# Patient Record
Sex: Male | Born: 1975 | Race: White | Hispanic: No | Marital: Married | Smoking: Former smoker
Health system: Southern US, Community
[De-identification: ages and names within clinical notes are randomized; demographics above are authoritative.]

## PROBLEM LIST (undated history)

## (undated) VITALS — BP 112/78 | HR 93 | Temp 97.5°F | Resp 16

## (undated) DIAGNOSIS — R748 Abnormal levels of other serum enzymes: Secondary | ICD-10-CM

## (undated) DIAGNOSIS — D649 Anemia, unspecified: Secondary | ICD-10-CM

## (undated) DIAGNOSIS — K219 Gastro-esophageal reflux disease without esophagitis: Secondary | ICD-10-CM

## (undated) DIAGNOSIS — K760 Fatty (change of) liver, not elsewhere classified: Secondary | ICD-10-CM

## (undated) DIAGNOSIS — F1010 Alcohol abuse, uncomplicated: Secondary | ICD-10-CM

## (undated) HISTORY — DX: Fatty (change of) liver, not elsewhere classified: K76.0

## (undated) HISTORY — DX: Anemia, unspecified: D64.9

## (undated) HISTORY — DX: Abnormal levels of other serum enzymes: R74.8

## (undated) HISTORY — DX: Gastro-esophageal reflux disease without esophagitis: K21.9

---

## 2006-11-16 ENCOUNTER — Emergency Department: Payer: Self-pay | Admitting: Emergency Medicine

## 2011-09-14 ENCOUNTER — Emergency Department (HOSPITAL_COMMUNITY): Payer: Self-pay

## 2011-09-14 ENCOUNTER — Emergency Department (HOSPITAL_COMMUNITY)
Admission: EM | Admit: 2011-09-14 | Discharge: 2011-09-14 | Disposition: A | Discharge status: Home / Self Care | Payer: Self-pay | Attending: Emergency Medicine | Admitting: Emergency Medicine

## 2011-09-14 ENCOUNTER — Encounter (HOSPITAL_COMMUNITY): Payer: Self-pay | Admitting: *Deleted

## 2011-09-14 DIAGNOSIS — R066 Hiccough: Secondary | ICD-10-CM | POA: Insufficient documentation

## 2011-09-14 DIAGNOSIS — R748 Abnormal levels of other serum enzymes: Secondary | ICD-10-CM

## 2011-09-14 DIAGNOSIS — R1110 Vomiting, unspecified: Secondary | ICD-10-CM | POA: Insufficient documentation

## 2011-09-14 DIAGNOSIS — R634 Abnormal weight loss: Secondary | ICD-10-CM | POA: Insufficient documentation

## 2011-09-14 DIAGNOSIS — R109 Unspecified abdominal pain: Secondary | ICD-10-CM | POA: Insufficient documentation

## 2011-09-14 DIAGNOSIS — R7402 Elevation of levels of lactic acid dehydrogenase (LDH): Secondary | ICD-10-CM | POA: Insufficient documentation

## 2011-09-14 DIAGNOSIS — R112 Nausea with vomiting, unspecified: Secondary | ICD-10-CM | POA: Insufficient documentation

## 2011-09-14 DIAGNOSIS — R7401 Elevation of levels of liver transaminase levels: Secondary | ICD-10-CM | POA: Insufficient documentation

## 2011-09-14 DIAGNOSIS — R51 Headache: Secondary | ICD-10-CM | POA: Insufficient documentation

## 2011-09-14 DIAGNOSIS — R160 Hepatomegaly, not elsewhere classified: Secondary | ICD-10-CM | POA: Insufficient documentation

## 2011-09-14 DIAGNOSIS — R630 Anorexia: Secondary | ICD-10-CM | POA: Insufficient documentation

## 2011-09-14 LAB — DIFFERENTIAL
Lymphocytes Relative: 24 % (ref 12–46)
Monocytes Absolute: 0.3 10*3/uL (ref 0.1–1.0)
Monocytes Relative: 9 % (ref 3–12)
Neutro Abs: 2.3 10*3/uL (ref 1.7–7.7)
Neutrophils Relative %: 64 % (ref 43–77)

## 2011-09-14 LAB — COMPREHENSIVE METABOLIC PANEL
AST: 207 U/L — ABNORMAL HIGH (ref 0–37)
Albumin: 2.4 g/dL — ABNORMAL LOW (ref 3.5–5.2)
Alkaline Phosphatase: 83 U/L (ref 39–117)
BUN: 9 mg/dL (ref 6–23)
CO2: 26 mEq/L (ref 19–32)
Chloride: 100 mEq/L (ref 96–112)
Creatinine, Ser: 0.81 mg/dL (ref 0.50–1.35)
GFR calc non Af Amer: >90 mL/min (ref >90)
Potassium: 3.1 mEq/L — ABNORMAL LOW (ref 3.5–5.1)
Total Bilirubin: 2.5 mg/dL — ABNORMAL HIGH (ref 0.3–1.2)

## 2011-09-14 LAB — CBC
HCT: 35.3 % — ABNORMAL LOW (ref 39.0–52.0)
Hemoglobin: 12.2 g/dL — ABNORMAL LOW (ref 13.0–17.0)
RBC: 3.65 MIL/uL — ABNORMAL LOW (ref 4.22–5.81)
WBC: 3.6 10*3/uL — ABNORMAL LOW (ref 4.0–10.5)

## 2011-09-14 LAB — URINALYSIS, ROUTINE W REFLEX MICROSCOPIC
Glucose, UA: NEGATIVE mg/dL
Leukocytes, UA: NEGATIVE
Nitrite: NEGATIVE
Specific Gravity, Urine: 1.022 (ref 1.005–1.030)
pH: 6.5 (ref 5.0–8.0)

## 2011-09-14 LAB — PROTIME-INR
INR: 1.39 (ref 0.00–1.49)
Prothrombin Time: 17.3 seconds — ABNORMAL HIGH (ref 11.6–15.2)

## 2011-09-14 MED ORDER — PROMETHAZINE HCL 25 MG PO TABS: 12.5000 mg | ORAL_TABLET | Freq: Four times a day (QID) | ORAL | Status: DC | PRN

## 2011-09-14 MED ORDER — TRAMADOL HCL 50 MG PO TABS: 50.0000 mg | ORAL_TABLET | Freq: Four times a day (QID) | ORAL | Status: AC | PRN

## 2011-09-14 MED ORDER — ONDANSETRON HCL 4 MG/2ML IJ SOLN
4.0000 mg | Freq: Once | INTRAMUSCULAR | Status: AC
  Administered 2011-09-14: 4 mg via INTRAVENOUS
  Filled 2011-09-14: qty 2

## 2011-09-14 MED ORDER — PANTOPRAZOLE SODIUM 40 MG IV SOLR
40.0000 mg | Freq: Once | INTRAVENOUS | Status: AC
  Administered 2011-09-14: 40 mg via INTRAVENOUS
  Filled 2011-09-14: qty 40

## 2011-09-14 MED ORDER — FAMOTIDINE 20 MG PO TABS: 20.0000 mg | ORAL_TABLET | Freq: Two times a day (BID) | ORAL | Status: DC

## 2011-09-14 MED ORDER — IOHEXOL 300 MG/ML  SOLN
100.0000 mL | Freq: Once | INTRAMUSCULAR | Status: AC | PRN
  Administered 2011-09-14: 100 mL via INTRAVENOUS

## 2011-09-14 MED ORDER — SODIUM CHLORIDE 0.9 % IV BOLUS (SEPSIS)
1000.0000 mL | Freq: Once | INTRAVENOUS | Status: AC
  Administered 2011-09-14: 1000 mL via INTRAVENOUS

## 2011-09-14 MED ORDER — SODIUM CHLORIDE 0.9 % IV SOLN
INTRAVENOUS | Status: DC
  Administered 2011-09-14 (×2): via INTRAVENOUS

## 2011-09-14 NOTE — ED Notes (Signed)
Multiple complaints. Having headaches, vomiting, abd pain. No distress noted at triage.

## 2011-09-14 NOTE — ED Provider Notes (Signed)
History     CSN: [id number]  Arrival date & time 09/14/11  1346   First MD Initiated Contact with Patient 09/14/11 1513      Chief Complaint  Patient presents with  . Headache  . Emesis  . Abdominal Pain    (Consider location/radiation/quality/duration/timing/severity/associated sxs/prior treatment) HPI.... epigastric abdominal pain for 5 weeks with radiation to bilateral flank. Unable to keep food and fluids down. 30 pound weight loss recently. Also complains of migraine headache and hiccups. Nothing makes discomfort better or worse. No jaundice.  No known past medical history. No medications. Minimal drinking of alcohol. No cigarettes   History reviewed. No pertinent past medical history.  History reviewed. No pertinent past surgical history.  History reviewed. No pertinent family history.  History  Substance Use Topics  . Smoking status: Not on file  . Smokeless tobacco: Not on file  . Alcohol Use: No      Review of Systems  All other systems reviewed and are negative.    Allergies  Review of patient's allergies indicates no known allergies.  Home Medications  No current outpatient prescriptions on file.  BP 124/86  Pulse 79  Temp(Src) 98.1 F (36.7 C) (Oral)  Resp 19  SpO2 98%  Physical Exam  Nursing note and vitals reviewed. Constitutional: He is oriented to person, place, and time.       Gaunt  HENT:  Head: Normocephalic and atraumatic.  Eyes: Conjunctivae and EOM are normal. Pupils are equal, round, and reactive to light.  Neck: Normal range of motion. Neck supple.  Cardiovascular: Normal rate and regular rhythm.   Pulmonary/Chest: Effort normal and breath sounds normal.  Abdominal: Soft. Bowel sounds are normal.       Minimal epigastric tenderness  Genitourinary:       Minimal bilateral flank tenderness  Musculoskeletal: Normal range of motion.  Neurological: He is alert and oriented to person, place, and time.  Skin: Skin is warm and dry.         No obvious jaundice  Psychiatric: He has a normal mood and affect.    ED Course  Procedures (including critical care time)  Labs Reviewed  URINALYSIS, ROUTINE W REFLEX MICROSCOPIC - Abnormal; Notable for the following:    Color, Urine AMBER (*) BIOCHEMICALS MAY BE AFFECTED BY COLOR   Bilirubin Urine SMALL (*)    Urobilinogen, UA 4.0 (*)    All other components within normal limits  CBC  DIFFERENTIAL  COMPREHENSIVE METABOLIC PANEL  LIPASE, BLOOD   No results found.   No diagnosis found.    MDM  IV hydration, labs, urinalysis, CT abdomen and pelvis, move to the CDU.        Donnetta Hutching, MD 09/14/11 [id number]

## 2011-09-14 NOTE — ED Notes (Signed)
Patient transported to CT 

## 2011-09-14 NOTE — ED Provider Notes (Signed)
5:25 PM Patient is in CDU holding for labs and CT.  No needs at this time.  Labs are abnormal, WBC low, mild electrolyte abnormalities, LFTs elevated (AST 207, ALT 132, Bilirubin 2.5).  Patient has had abdominal pain, weight loss, N/V x 5 weeks.  Patient is currently being taken to CT. Will continue to follow.    6:35 PM Discussed results with patient and with Dr Adriana Simas.  Will order abdominal US.  If normal, will discuss with GI.    9:29 PM [place] is normal.  I called and spoke with Dr Juanda Chance (GI).  Dr Juanda Chance will see the patient in her office and requested I order several labs while he is in the ED to expedite his workup given the concerning history and CT and lab results.  States he will not benefit at this time from admission but will need a full hepatic workup as an outpatient.  I discussed this with the patient and stressed the importance of close follow up.  Patient verbalizes understanding and agrees with plan.  Labs have been collected - Dr Juanda Chance to follow up on all results with patient.  Plan is for d/c home with pepcid, tramadol, phenergan (per Dr Regino Schultze recommendations).    Dillard Cannon [employer], [place] 09/14/11 2159

## 2011-09-14 NOTE — ED Notes (Signed)
Amber, U/S [employer], will call back if unable to view order for u/s.

## 2011-09-14 NOTE — ED Notes (Signed)
Pt drinking contrast. 

## 2011-09-14 NOTE — ED Notes (Signed)
Pt NAD, resp e/u, AOx4, pt ambulatory with steady gait. Pt states understanding of discharge orders and denies questions at time of discharge.

## 2011-09-14 NOTE — ED Notes (Signed)
Radiology notified that pt finished drinking his contrast.

## 2011-09-15 ENCOUNTER — Encounter: Payer: Self-pay | Admitting: *Deleted

## 2011-09-15 ENCOUNTER — Telehealth: Payer: Self-pay | Admitting: *Deleted

## 2011-09-15 LAB — HEPATITIS B SURFACE ANTIBODY,QUALITATIVE: Hep B S Ab: NEGATIVE

## 2011-09-15 LAB — HIV ANTIBODY (ROUTINE TESTING W REFLEX): HIV: NONREACTIVE

## 2011-09-15 LAB — HEPATITIS C ANTIBODY: HCV Ab: NEGATIVE

## 2011-09-15 LAB — AFP TUMOR MARKER: AFP-Tumor Marker: 6.1 ng/mL (ref 0.0–8.0)

## 2011-09-15 NOTE — Telephone Encounter (Signed)
Spoke with patient to schedule OV. He works a late shift and Wednesday after 1:00PM is the best time for him in order not to miss work. Scheduled patient on 09/24/11 at 3:00 PM. Letter mailed.

## 2011-09-15 NOTE — Telephone Encounter (Signed)
Message copied by Daphine Deutscher on Mon Sep 15, 2011  9:08 AM ------      Message from: [place], [place] M      Created: Sun Sep 14, 2011  9:05 PM      Regarding: new patient       Hello Rene Kocher, this pt was seen  at [facility] ED tonight and will be  calling for appointment. Please add him on. He has abnormal LFT's epig.pain, N&V. He may have hepatitis.Marland KitchenMarland KitchenHis LFT's are in epic

## 2011-09-15 NOTE — ED Provider Notes (Signed)
Medical screening examination/treatment/procedure(s) were conducted as a shared visit with non-physician practitioner(s) and myself.  I personally evaluated the patient during the encounter.  Seen by me. Patient with close followup by gastroenterologist for hepatomegaly and pain elevation of liver function  Donnetta Hutching, MD 09/15/11 1840

## 2011-09-24 ENCOUNTER — Encounter: Payer: Self-pay | Admitting: Internal Medicine

## 2011-09-24 ENCOUNTER — Ambulatory Visit (INDEPENDENT_AMBULATORY_CARE_PROVIDER_SITE_OTHER): Payer: Self-pay | Admitting: Internal Medicine

## 2011-09-24 ENCOUNTER — Other Ambulatory Visit (INDEPENDENT_AMBULATORY_CARE_PROVIDER_SITE_OTHER): Payer: Self-pay

## 2011-09-24 VITALS — BP 118/74 | HR 88 | Ht 74.0 in | Wt 127.0 lb

## 2011-09-24 DIAGNOSIS — R7989 Other specified abnormal findings of blood chemistry: Secondary | ICD-10-CM

## 2011-09-24 LAB — COMPREHENSIVE METABOLIC PANEL
ALT: 117 U/L — ABNORMAL HIGH (ref 0–53)
Albumin: 3.1 g/dL — ABNORMAL LOW (ref 3.5–5.2)
CO2: 30 mEq/L (ref 19–32)
Calcium: 8.3 mg/dL — ABNORMAL LOW (ref 8.4–10.5)
Chloride: 96 mEq/L (ref 96–112)
Creatinine, Ser: 0.9 mg/dL (ref 0.4–1.5)
GFR: 108.02 mL/min (ref >60.00)
Sodium: 134 mEq/L — ABNORMAL LOW (ref 135–145)
Total Protein: 6.5 g/dL (ref 6.0–8.3)

## 2011-09-24 LAB — MONONUCLEOSIS SCREEN: Mono Screen: NEGATIVE

## 2011-09-24 NOTE — Patient Instructions (Addendum)
Your physician has requested that you go to the basement for the following lab work before leaving today: CMET, Mono Screen, Anti Smooth Muscle Antibody, IgG, IgM, Tissue Transglutamase, Sedimentation Rate You will be due for repeat CMET on 10/08/11. Just go to the basement floor on that day, no need to come to Dr Regino Schultze office.

## 2011-09-24 NOTE — Progress Notes (Signed)
Troy Foster [birth year] MRN [id number]    History of Present Illness:  This is a 36 year old white male with abnormal liver function tests. He was seen in the emergency room 2 weeks ago with nausea, vomiting and abnormal liver function tests showing transaminases into the 300s with a normal alkaline phosphatase. A CT scan of the abdomen showed hepatomegaly and fatty infiltration of the liver which was confirmed on an upper abdominal ultrasound which showed diffuse increased liver echo density, normal intrahepatic biliary duct dilatation, normal size spleen of 7.3 cm and common bile duct of 3 mm. There is no family history of liver disease, he denies drinking alcohol. He has not used any drugs. His hepatitis B and C serologies have been negative. His ANA titer, lipase and amylase are normal as well and his serum ferritin is 753.He has lost 20 lbs  In past 4 weeks but is feeling 50% better   Past Medical History  Diagnosis Date  . Fatty liver   . Anemia   . Elevated liver enzymes   . GERD (gastroesophageal reflux disease)    Past Surgical History  Procedure Date  . No past surgeries     reports that he quit smoking about 3 months ago. His smoking use included Cigarettes. He quit after 6 years of use. He has never used smokeless tobacco. He reports that he drinks alcohol. He reports that he does not use illicit drugs. family history includes Diabetes in his father and paternal aunt and Testicular cancer in his father.  There is no history of Colon cancer. No Known Allergies      Review of Systems: Positive for a rash on his face and left lower leg. Denies heartburn abdominal pain. His level of energy he has been improving, denies night sweats. Positive for weight loss of 20 pounds  The remainder of the 10 point ROS is negative except as outlined in H&P   Physical Exam: General appearance  Well developed, in no distress. Very thin Eyes- non icteric. HEENT nontraumatic,  normocephalic. Mouth no lesions, tongue papillated, no cheilosis. Neck supple without adenopathy, thyroid not enlarged, no carotid bruits, no JVD. Lungs Clear to auscultation bilaterally. Cor normal S1, normal S2, regular rhythm, no murmur,  quiet precordium. Abdomen: Soft with minimal discomfort along the right upper quadrant and overlying the liver. Liver span 15 centimeters. It is slightly tender. Splenic tip not palpable. No ascites. Rectal: Not done Extremities no pedal edema. Skin no lesions. No spider nevi or stigmata of chronic liver disease. Acne-like rash on his for head and malar surface Neurological alert and oriented x 3. Psychological normal mood and affect.  Assessment and Plan:  Problem #1 Abnormal liver function tests consistent with acute hepatitis, non B or C viral hepatitis. The most common causes such as autoimmune liver disease  having ruled out. He is feeling much better. Autoimmune hepatitis has been ruled out by a negative ANA titer. We will obtain IgG and IgA levels. We will also check a Mono screen. We will repeat his liver function tests today. As long as they are improving, we will continue to observe but if they remain abnormal, we will consider liver biopsy for further diagnosis. He will have his liver function tests rechecked in 2 weeks., there is no evidence of cirrhosis or portal hypertension   09/24/2011 Troy Foster

## 2011-09-25 ENCOUNTER — Telehealth: Payer: Self-pay | Admitting: *Deleted

## 2011-09-25 DIAGNOSIS — R945 Abnormal results of liver function studies: Secondary | ICD-10-CM

## 2011-09-25 LAB — IGG: IgG (Immunoglobin G), Serum: 1370 mg/dL (ref 650–1600)

## 2011-09-25 NOTE — Telephone Encounter (Signed)
Message copied by Daphine Deutscher on Thu Sep 25, 2011 11:31 AM ------      Message from: Hart Carwin      Created: Thu Sep 25, 2011  9:41 AM       Please call pt. LFT's are still elevated. Repeat in 2 weeks as we agreed. OV in 4-6 weeks.

## 2011-09-25 NOTE — Telephone Encounter (Signed)
Spoke with patient and gave him results and recommendations. Labs in [facility] for 10/09/11. Patient to call back to schedule OV this afternoon.

## 2011-09-29 ENCOUNTER — Encounter: Payer: Self-pay | Admitting: *Deleted

## 2011-10-08 ENCOUNTER — Other Ambulatory Visit (INDEPENDENT_AMBULATORY_CARE_PROVIDER_SITE_OTHER): Payer: Self-pay

## 2011-10-08 DIAGNOSIS — R7989 Other specified abnormal findings of blood chemistry: Secondary | ICD-10-CM

## 2011-10-08 DIAGNOSIS — R945 Abnormal results of liver function studies: Secondary | ICD-10-CM

## 2011-10-09 LAB — COMPREHENSIVE METABOLIC PANEL
ALT: 84 U/L — ABNORMAL HIGH (ref 0–53)
AST: 133 U/L — ABNORMAL HIGH (ref 0–37)
Alkaline Phosphatase: 90 U/L (ref 39–117)
Creatinine, Ser: 0.9 mg/dL (ref 0.4–1.5)
Total Bilirubin: 2.5 mg/dL — ABNORMAL HIGH (ref 0.3–1.2)

## 2011-10-09 LAB — HEPATIC FUNCTION PANEL
AST: 133 U/L — ABNORMAL HIGH (ref 0–37)
Albumin: 2.9 g/dL — ABNORMAL LOW (ref 3.5–5.2)
Alkaline Phosphatase: 90 U/L (ref 39–117)
Total Protein: 6.4 g/dL (ref 6.0–8.3)

## 2011-10-10 ENCOUNTER — Telehealth: Payer: Self-pay | Admitting: *Deleted

## 2011-10-10 DIAGNOSIS — R945 Abnormal results of liver function studies: Secondary | ICD-10-CM

## 2011-10-10 NOTE — Telephone Encounter (Signed)
Message copied by Daphine Deutscher on Fri Oct 10, 2011  9:13 AM ------      Message from: Hart Carwin      Created: Thu Oct 09, 2011  6:39 PM       I tried to call him on his home # listed bu,tdoes not connect. I could not find any ooother number. Please try to contact him to tell hem that his LFT's are not getting any better ans ask how he is feeling. I am leaning toward doing a liver biopsy. Dos he have an apoointment? If he does not have an insurance leave me a # I can call him and explain

## 2011-10-10 NOTE — Telephone Encounter (Signed)
Spoke with patient and he states Dr. Juanda Chance spoke with him and he is ready to have the liver biopsy. Spoke with Dr. Juanda Chance and patient needs ultrasound guided liver biopsy for abnormal LFT's, fatty liver, nausea and vomiting. Order EPIC and spoke with [facility] radiology scheduler. She will call patient with appointment.

## 2011-10-15 ENCOUNTER — Other Ambulatory Visit: Payer: Self-pay | Admitting: Radiology

## 2011-10-15 ENCOUNTER — Other Ambulatory Visit: Payer: Self-pay | Admitting: Internal Medicine

## 2011-10-15 NOTE — Telephone Encounter (Signed)
Dr Juanda Chance, it appears that this patient was given a prescription for famotidine by a PA in the emergency room. He now requests refills. Do you want me to give him refills of this with you as the prescriber?

## 2011-10-15 NOTE — Telephone Encounter (Signed)
He does not need Famotidine.

## 2011-10-16 NOTE — Telephone Encounter (Signed)
Spoke with patient and gave him Dr. Brodie's recommendation 

## 2011-10-23 ENCOUNTER — Encounter (HOSPITAL_COMMUNITY): Payer: Self-pay | Admitting: Pharmacy Technician

## 2011-10-24 ENCOUNTER — Encounter (HOSPITAL_COMMUNITY): Payer: Self-pay

## 2011-10-24 ENCOUNTER — Ambulatory Visit (HOSPITAL_COMMUNITY)
Admission: RE | Admit: 2011-10-24 | Discharge: 2011-10-24 | Disposition: A | Discharge status: Home / Self Care | Payer: Self-pay | Source: Ambulatory Visit | Attending: Internal Medicine | Admitting: Internal Medicine

## 2011-10-24 DIAGNOSIS — R7989 Other specified abnormal findings of blood chemistry: Secondary | ICD-10-CM | POA: Insufficient documentation

## 2011-10-24 DIAGNOSIS — K7689 Other specified diseases of liver: Secondary | ICD-10-CM | POA: Insufficient documentation

## 2011-10-24 DIAGNOSIS — D649 Anemia, unspecified: Secondary | ICD-10-CM | POA: Insufficient documentation

## 2011-10-24 DIAGNOSIS — R945 Abnormal results of liver function studies: Secondary | ICD-10-CM

## 2011-10-24 DIAGNOSIS — K219 Gastro-esophageal reflux disease without esophagitis: Secondary | ICD-10-CM | POA: Insufficient documentation

## 2011-10-24 LAB — CBC
HCT: 39.4 % (ref 39.0–52.0)
Hemoglobin: 14 g/dL (ref 13.0–17.0)
MCV: 97.8 fL (ref 78.0–100.0)
RBC: 4.03 MIL/uL — ABNORMAL LOW (ref 4.22–5.81)
WBC: 4.4 10*3/uL (ref 4.0–10.5)

## 2011-10-24 MED ORDER — SODIUM CHLORIDE 0.9 % IV SOLN: Freq: Once | INTRAVENOUS | Status: DC

## 2011-10-24 MED ORDER — MIDAZOLAM HCL 5 MG/5ML IJ SOLN
INTRAMUSCULAR | Status: AC | PRN
  Administered 2011-10-24 (×2): 1 mg via INTRAVENOUS

## 2011-10-24 MED ORDER — FENTANYL CITRATE 0.05 MG/ML IJ SOLN
INTRAMUSCULAR | Status: AC | PRN
  Administered 2011-10-24: 25 ug via INTRAVENOUS
  Administered 2011-10-24: 50 ug via INTRAVENOUS

## 2011-10-24 MED ORDER — MIDAZOLAM HCL 2 MG/2ML IJ SOLN
INTRAMUSCULAR | Status: AC
  Filled 2011-10-24: qty 4

## 2011-10-24 MED ORDER — FENTANYL CITRATE 0.05 MG/ML IJ SOLN
INTRAMUSCULAR | Status: AC
  Filled 2011-10-24: qty 4

## 2011-10-24 MED ORDER — SODIUM CHLORIDE 0.9 % IV SOLN
INTRAVENOUS | Status: AC | PRN
  Administered 2011-10-24: 50 mL/h via INTRAVENOUS

## 2011-10-24 NOTE — ED Notes (Signed)
Taken to nurses station and report to [employer]

## 2011-10-24 NOTE — Procedures (Signed)
Technically successful US guided biopsy of right lobe of the liver.  No immediate complications.  

## 2011-10-24 NOTE — ED Notes (Signed)
Bandaid right lateral abd clean dry and intact.

## 2011-10-24 NOTE — ED Notes (Signed)
Report  Given to Short stay RN. Pt. Transported to C-6 via stretcher. VSS. Bandaid CDI.

## 2011-10-24 NOTE — H&P (Signed)
Troy Foster is an 36 y.o. male.   Chief Complaint: Abdominal pain; nausea; abnormal liver function tests HPI: scheduled for liver core biopsy  Past Medical History  Diagnosis Date  . Fatty liver   . Anemia   . Elevated liver enzymes   . GERD (gastroesophageal reflux disease)     Past Surgical History  Procedure Date  . No past surgeries     Family History  Problem Relation Age of Onset  . Colon cancer Neg Hx   . Diabetes Father   . Diabetes Paternal Aunt   . Testicular cancer Father    Social History:  reports that he quit smoking about 4 months ago. His smoking use included Cigarettes. He quit after 6 years of use. He has never used smokeless tobacco. He reports that he drinks alcohol. He reports that he does not use illicit drugs.  Allergies: No Known Allergies  Medications Prior to Admission  Medication Sig Dispense Refill  . famotidine (PEPCID) 20 MG tablet Take 1 tablet (20 mg total) by mouth 2 (two) times daily.  30 tablet  0  . promethazine (PHENERGAN) 25 MG tablet Take 25 mg by mouth every 6 (six) hours as needed. For nausea      . traMADol (ULTRAM) 50 MG tablet Take 50 mg by mouth every 8 (eight) hours as needed. For pain       Medications Prior to Admission  Medication Dose Route Frequency Provider Last Rate Last Dose  . 0.9 %  sodium chloride infusion   Intravenous Once Robet Leu, PA        No results found for this or any previous visit (from the past 48 hour(s)). No results found.  Review of Systems  Constitutional: Negative for fever.  Respiratory: Negative for cough.   Cardiovascular: Negative for chest pain.  Gastrointestinal: Positive for nausea and abdominal pain. Negative for vomiting.  Neurological: Negative for headaches.    Blood pressure 138/87, pulse 61, temperature 97.3 F (36.3 C), temperature source Oral, resp. rate 20, height 6\' 2"  (1.88 m), weight 130 lb (58.968 kg), SpO2 99.00%. Physical Exam  Constitutional: He is oriented to  person, place, and time. He appears well-developed and well-nourished.  HENT:  Head: Normocephalic.  Eyes: EOM are normal.  Neck: Normal range of motion.  Cardiovascular: Normal rate, regular rhythm and normal heart sounds.   No murmur heard. Respiratory: Effort normal and breath sounds normal. He has no wheezes.  GI: Soft. Bowel sounds are normal. There is no tenderness.  Musculoskeletal: Normal range of motion.  Neurological: He is alert and oriented to person, place, and time.  Skin: Skin is warm and dry.     Assessment/Plan Continued abd pain and nausea; Elevated LFTs Scheduled now for liver core biopsy Pt aware of procedure benefits and risks and agreeable to proceed. Consent signed.  Brooklinn Longbottom A 10/24/2011, 9:15 AM

## 2011-10-27 ENCOUNTER — Telehealth (HOSPITAL_COMMUNITY): Payer: Self-pay

## 2011-10-28 ENCOUNTER — Ambulatory Visit: Payer: Self-pay | Admitting: Internal Medicine

## 2011-10-28 ENCOUNTER — Telehealth: Payer: Self-pay | Admitting: *Deleted

## 2011-10-28 NOTE — Telephone Encounter (Signed)
Message copied by Daphine Deutscher on Tue Oct 28, 2011  1:59 PM ------      Message from: Hart Carwin      Created: Tue Oct 28, 2011 12:53 PM      Regarding: fax Dolan Amen, could You, please, fax my OV note from 09/24/2011 to Dr Garnette Czech in Pathology, she needs it to send his slides to [place] for second opinion. Fax [phone]. Thanx DB, she is waiting for it.

## 2011-10-28 NOTE — Telephone Encounter (Signed)
Faxed note to Dr. Nedra Hai as requested.

## 2011-10-29 ENCOUNTER — Telehealth: Payer: Self-pay | Admitting: *Deleted

## 2011-10-29 DIAGNOSIS — R945 Abnormal results of liver function studies: Secondary | ICD-10-CM

## 2011-10-29 NOTE — Telephone Encounter (Signed)
Please make appointment, I want to have results of his blood tests and the liver Bx from [facility]

## 2011-10-29 NOTE — Telephone Encounter (Signed)
Spoke with patient and he can be reached today at [phone]. He states he thought the OV yesterday was cancelled.  He states he will come in for the labs.

## 2011-10-29 NOTE — Telephone Encounter (Signed)
Patient will come for labs as soon as possible.  REV scheduled for 11/04/11 11:00

## 2011-10-29 NOTE — Telephone Encounter (Signed)
Message copied by Daphine Deutscher on Wed Oct 29, 2011  8:42 AM ------      Message from: Hart Carwin      Created: Tue Oct 28, 2011  5:31 PM      Regarding: no show       Rene Kocher, he did not show up and I need to discuss his liver biopsy with him. His tel.# is not operating. Can You find out how to get hold of him. They sent his liver biopsy for second opinion to [place]. Also, if You get hold of him, he needs Alpha-1-antitrypsin and another set of LFT's.

## 2011-10-30 ENCOUNTER — Telehealth: Payer: Self-pay | Admitting: Internal Medicine

## 2011-10-31 HISTORY — PX: LIVER BIOPSY: SHX301

## 2011-10-31 NOTE — Telephone Encounter (Signed)
Spoke with patient and told him Dr. Juanda Chance will be going over biopsy results on OV 11/04/11. Some of the test were sent out and we will have complete results then.

## 2011-10-31 NOTE — Telephone Encounter (Signed)
Unable to reach patient at number given. Will try again later.

## 2011-11-03 ENCOUNTER — Telehealth: Payer: Self-pay | Admitting: Internal Medicine

## 2011-11-03 NOTE — Telephone Encounter (Signed)
Spoke with patient and he states he cannot get off work to come for his appointment tomorrow. He states he could come at 3:45 PM but we do not have an appointment available at that time. He is asking for Dr. Juanda Chance to call him with results. He states she can leave a message at his number and he will call her back.

## 2011-11-03 NOTE — Telephone Encounter (Signed)
I will call him.

## 2011-11-04 ENCOUNTER — Ambulatory Visit: Payer: Self-pay | Admitting: Internal Medicine

## 2011-11-05 NOTE — Telephone Encounter (Signed)
It is VERY strange that  We cannot call him directly. And that the letter came back. I will try to call him when I get back but I would prefer to meet with him personally.

## 2011-11-06 ENCOUNTER — Telehealth: Payer: Self-pay | Admitting: Internal Medicine

## 2011-11-06 NOTE — Telephone Encounter (Signed)
I will try to call him on Monday 11/09/2011. I would like to talk to his dad at some point if he allows me to.

## 2011-11-06 NOTE — Telephone Encounter (Signed)
Spoke with patient and told him I will forward the request to Dr. Juanda Chance however, she is out of the office and I do not know when she may answer.

## 2011-11-07 NOTE — Telephone Encounter (Signed)
Patient notified that Dr. Juanda Chance will call on Monday.

## 2011-11-10 NOTE — Telephone Encounter (Signed)
Spoke with patient and he is asking for results. He understands that Dr. Juanda Chance will be calling

## 2011-11-11 NOTE — Telephone Encounter (Signed)
Spoke with patient and scheduled him on 11/12/11 at 3:30 PM with Dr. Juanda Chance.

## 2011-11-12 ENCOUNTER — Encounter: Payer: Self-pay | Admitting: Internal Medicine

## 2011-11-12 ENCOUNTER — Ambulatory Visit (INDEPENDENT_AMBULATORY_CARE_PROVIDER_SITE_OTHER): Payer: Self-pay | Admitting: Internal Medicine

## 2011-11-12 VITALS — BP 134/88 | HR 123 | Ht 74.0 in | Wt 128.4 lb

## 2011-11-12 DIAGNOSIS — K7689 Other specified diseases of liver: Secondary | ICD-10-CM

## 2011-11-12 DIAGNOSIS — K739 Chronic hepatitis, unspecified: Secondary | ICD-10-CM

## 2011-11-12 DIAGNOSIS — R112 Nausea with vomiting, unspecified: Secondary | ICD-10-CM

## 2011-11-12 MED ORDER — ESOMEPRAZOLE MAGNESIUM 40 MG PO CPDR: 40.0000 mg | DELAYED_RELEASE_CAPSULE | Freq: Every day | ORAL | Status: DC

## 2011-11-12 MED ORDER — PREDNISONE 10 MG PO TABS: ORAL_TABLET | ORAL | Status: DC

## 2011-11-12 NOTE — Progress Notes (Signed)
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 He has hepatomegaly on a CT scan and fatty liver confirmed on an upper abdominal ultrasound which showed increased echochogenicity but a normal splenic size of 7.3 cm with a common bile duct of 3 mm. He denies significant alcohol use or abuse, having a maximum of 6 beers per week. He is vomiting almost daily. His hepatitis B and C serologies, ANA titer as well his lipase and amylase were normal. Serum ferritin levels were mildly elevated but his iron saturations were normal. He is negative for CMV. His IgG and IgA were normal. There was mild elevation of IgM at 448. His sprue profile is negative. Differential diagnosis of his liver biopsies include  Alcohol use or starvation as could be seen in third world countries with protein calorie malnutrition. Alpha-1 antitrypsin deficiency has been ruled out. He has been vomiting coffee ground material. He has run out of his Pepcid. He also experiences muscle aches and cramps at night and extreme weakness of his muscles.   Past Medical History  Diagnosis Date  . Fatty liver   . Anemia   . Elevated liver enzymes   . GERD (gastroesophageal reflux disease)    Past Surgical History  Procedure Date  . No past surgeries     reports that he quit smoking about 4 months ago. His smoking use included Cigarettes. He quit after 6 years of use. He has never used smokeless tobacco. He reports that he drinks alcohol. He reports that he does not use illicit drugs. family history includes Diabetes in his father and paternal aunt and Testicular cancer in his father.  There is no history of Colon cancer. No Known Allergies      Review of Systems: Positive for nausea vomiting and coffee-ground emesis  The remainder of the 10 point  ROS is negative except as outlined in H&P   Physical Exam: General appearance  Well developed, in no distress. Eyes- non icteric. HEENT nontraumatic, normocephalic. Mouth no lesions, tongue papillated, no cheilosis. Neck supple without adenopathy, thyroid not enlarged, no carotid bruits, no JVD. Lungs Clear to auscultation bilaterally. Cor normal S1, normal S2, regular rhythm, no murmur,  quiet precordium. Abdomen: Soft nontender with the exception of right upper quadrant. Tender liver. No ascites. Rectal: Not done. Extremities no pedal edema. Skin no lesions. No spider nevi, acne on his face. Neurological alert and oriented x 3. No asterixis. Psychological normal mood and affect.  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 This is possibly related to repeated vomiting and suboptimal protein calorie nutrition. I cannot rule out autoimmune disease although his ANA titer was negative. He has myalgias and muscle weakness. We need to rule out myositis. We will check a CPK and LDH levels as well as repeat his liver function tests today. We have given him samples of Nexium 40 mg daily and scheduled him for an upper endoscopy to assess for gastric outlet obstruction. I will start him empirically on prednisone 30 mg a day for the next 2 weeks.   11/12/2011 Troy Foster

## 2011-11-12 NOTE — Patient Instructions (Signed)
Your physician has requested that you go to the basement for the following lab work before leaving today: CMET, Magnesium, CPK, LDH You have been scheduled for an endoscopy with propofol. Please follow written instructions given to you at your visit today. We have given you samples of Nexium to take once daily. Do not take Pepcid while taking this medication (as they are both used for GERD). Dr Juanda Chance has advised that you be on a prednisone taper. The taper instructions are as follows: 30 mg (3 tablets) by mouth once daily x 2 weeks.... Then, call our office to let us know how you are doing so we can better decide how quickly to taper your prednisone.

## 2011-11-19 ENCOUNTER — Telehealth: Payer: Self-pay | Admitting: Internal Medicine

## 2011-11-19 NOTE — Telephone Encounter (Signed)
Patient calling to see what he needs to do to get records for [facility]. States he had to go to ED down there yesterday because they thought he had a blood clot and the MD wants to copy of our records. Patient told how to get a copy of records from our medical records.

## 2011-12-12 ENCOUNTER — Telehealth: Payer: Self-pay | Admitting: Internal Medicine

## 2011-12-12 ENCOUNTER — Telehealth: Payer: Self-pay | Admitting: *Deleted

## 2011-12-12 NOTE — Telephone Encounter (Signed)
Patient is calling requesting a referral to [facility] States he tried to get an appointment there and they will not see him without a "referral" from Dr. Juanda Chance.

## 2011-12-12 NOTE — Telephone Encounter (Signed)
Pt may pick up his medical records from [place]. I did not refer him to [facility]. He did not show up for his EGD.

## 2011-12-12 NOTE — Telephone Encounter (Signed)
Spoke with Verlon Au and told her we did not refer patient to [facility]. He transferred his care. We can provide his records when proper paper work is completed.

## 2011-12-12 NOTE — Telephone Encounter (Signed)
Transferred GI Care. °

## 2011-12-12 NOTE — Telephone Encounter (Signed)
Unable to reach patient at provided number. No way to leave a message.

## 2011-12-15 NOTE — Telephone Encounter (Signed)
Unable to reach patient.

## 2011-12-16 NOTE — Telephone Encounter (Signed)
Spoke with patient and gave him Dr. Regino Schultze answer.

## 2011-12-17 ENCOUNTER — Encounter: Payer: Self-pay | Admitting: Internal Medicine

## 2012-01-08 ENCOUNTER — Emergency Department: Payer: Self-pay | Admitting: Emergency Medicine

## 2012-01-08 LAB — COMPREHENSIVE METABOLIC PANEL
Albumin: 4.3 g/dL (ref 3.4–5.0)
Alkaline Phosphatase: 91 U/L (ref 50–136)
Calcium, Total: 8.8 mg/dL (ref 8.5–10.1)
Co2: 23 mmol/L (ref 21–32)
Creatinine: 0.97 mg/dL (ref 0.60–1.30)
EGFR (African American): >60
EGFR (Non-African Amer.): >60
SGPT (ALT): 53 U/L
Total Protein: 7.9 g/dL (ref 6.4–8.2)

## 2012-01-08 LAB — DRUG SCREEN, URINE
Barbiturates, Ur Screen: NEGATIVE (ref ?–200)
Cannabinoid 50 Ng, Ur ~~LOC~~: NEGATIVE (ref ?–50)
MDMA (Ecstasy)Ur Screen: NEGATIVE (ref ?–500)
Methadone, Ur Screen: NEGATIVE (ref ?–300)
Opiate, Ur Screen: NEGATIVE (ref ?–300)
Phencyclidine (PCP) Ur S: NEGATIVE (ref ?–25)

## 2012-01-08 LAB — CBC
HCT: 38.7 % — ABNORMAL LOW (ref 40.0–52.0)
Platelet: 181 10*3/uL (ref 150–440)

## 2012-03-04 ENCOUNTER — Encounter (HOSPITAL_COMMUNITY): Payer: Self-pay | Admitting: *Deleted

## 2012-03-04 ENCOUNTER — Emergency Department (HOSPITAL_COMMUNITY)
Admission: EM | Admit: 2012-03-04 | Discharge: 2012-03-05 | Disposition: A | Discharge status: Other Acute Inpatient Hospital | Payer: Self-pay | Attending: Emergency Medicine | Admitting: Emergency Medicine

## 2012-03-04 DIAGNOSIS — F1010 Alcohol abuse, uncomplicated: Secondary | ICD-10-CM | POA: Insufficient documentation

## 2012-03-04 DIAGNOSIS — K219 Gastro-esophageal reflux disease without esophagitis: Secondary | ICD-10-CM | POA: Insufficient documentation

## 2012-03-04 DIAGNOSIS — Z87891 Personal history of nicotine dependence: Secondary | ICD-10-CM | POA: Insufficient documentation

## 2012-03-04 DIAGNOSIS — F1910 Other psychoactive substance abuse, uncomplicated: Secondary | ICD-10-CM

## 2012-03-04 HISTORY — DX: Alcohol abuse, uncomplicated: F10.10

## 2012-03-04 LAB — RAPID URINE DRUG SCREEN, HOSP PERFORMED
Amphetamines: NOT DETECTED
Benzodiazepines: NOT DETECTED
Opiates: NOT DETECTED

## 2012-03-04 LAB — CBC WITH DIFFERENTIAL/PLATELET
Basophils Absolute: 0.1 10*3/uL (ref 0.0–0.1)
Eosinophils Absolute: 0.1 10*3/uL (ref 0.0–0.7)
Eosinophils Relative: 2 % (ref 0–5)
HCT: 43.1 % (ref 39.0–52.0)
Lymphocytes Relative: 40 % (ref 12–46)
Lymphs Abs: 2 10*3/uL (ref 0.7–4.0)
MCH: 32.7 pg (ref 26.0–34.0)
MCV: 95.1 fL (ref 78.0–100.0)
Monocytes Absolute: 0.4 10*3/uL (ref 0.1–1.0)
Platelets: 161 10*3/uL (ref 150–400)
RDW: 12.8 % (ref 11.5–15.5)

## 2012-03-04 LAB — POTASSIUM: Potassium: 2.7 mEq/L — CL (ref 3.5–5.1)

## 2012-03-04 LAB — COMPREHENSIVE METABOLIC PANEL
ALT: 220 U/L — ABNORMAL HIGH (ref 0–53)
CO2: 29 mEq/L (ref 19–32)
Calcium: 9.4 mg/dL (ref 8.4–10.5)
Creatinine, Ser: 0.69 mg/dL (ref 0.50–1.35)
GFR calc Af Amer: >90 mL/min (ref >90)
GFR calc non Af Amer: >90 mL/min (ref >90)
Glucose, Bld: 123 mg/dL — ABNORMAL HIGH (ref 70–99)
Sodium: 140 mEq/L (ref 135–145)
Total Protein: 7.8 g/dL (ref 6.0–8.3)

## 2012-03-04 LAB — URINALYSIS, ROUTINE W REFLEX MICROSCOPIC
Glucose, UA: NEGATIVE mg/dL
Leukocytes, UA: NEGATIVE
Nitrite: NEGATIVE
Specific Gravity, Urine: 1.004 — ABNORMAL LOW (ref 1.005–1.030)
pH: 7 (ref 5.0–8.0)

## 2012-03-04 LAB — MAGNESIUM: Magnesium: 1.6 mg/dL (ref 1.5–2.5)

## 2012-03-04 MED ORDER — LORAZEPAM 1 MG PO TABS
0.0000 mg | ORAL_TABLET | Freq: Four times a day (QID) | ORAL | Status: DC
  Administered 2012-03-05 (×2): 1 mg via ORAL
  Filled 2012-03-04: qty 1

## 2012-03-04 MED ORDER — THIAMINE HCL 100 MG/ML IJ SOLN
100.0000 mg | Freq: Every day | INTRAMUSCULAR | Status: DC
  Administered 2012-03-04: 100 mg via INTRAVENOUS
  Filled 2012-03-04: qty 2

## 2012-03-04 MED ORDER — POTASSIUM CHLORIDE 10 MEQ/100ML IV SOLN
10.0000 meq | Freq: Once | INTRAVENOUS | Status: AC
  Administered 2012-03-04: 10 meq via INTRAVENOUS
  Filled 2012-03-04: qty 100

## 2012-03-04 MED ORDER — FOLIC ACID 1 MG PO TABS
1.0000 mg | ORAL_TABLET | Freq: Every day | ORAL | Status: DC
  Administered 2012-03-04 – 2012-03-05 (×2): 1 mg via ORAL
  Filled 2012-03-04 (×2): qty 1

## 2012-03-04 MED ORDER — ONDANSETRON HCL 8 MG PO TABS: 4.0000 mg | ORAL_TABLET | Freq: Three times a day (TID) | ORAL | Status: DC | PRN

## 2012-03-04 MED ORDER — IBUPROFEN 200 MG PO TABS: 600.0000 mg | ORAL_TABLET | Freq: Three times a day (TID) | ORAL | Status: DC | PRN

## 2012-03-04 MED ORDER — POTASSIUM CHLORIDE CRYS ER 20 MEQ PO TBCR
20.0000 meq | EXTENDED_RELEASE_TABLET | Freq: Two times a day (BID) | ORAL | Status: DC
  Administered 2012-03-05 (×2): 20 meq via ORAL
  Filled 2012-03-04 (×2): qty 1

## 2012-03-04 MED ORDER — LORAZEPAM 1 MG PO TABS: 1.0000 mg | ORAL_TABLET | Freq: Three times a day (TID) | ORAL | Status: DC | PRN

## 2012-03-04 MED ORDER — ADULT MULTIVITAMIN W/MINERALS CH
1.0000 | ORAL_TABLET | Freq: Every day | ORAL | Status: DC
  Administered 2012-03-04 – 2012-03-05 (×2): 1 via ORAL
  Filled 2012-03-04 (×2): qty 1

## 2012-03-04 MED ORDER — ZOLPIDEM TARTRATE 5 MG PO TABS: 5.0000 mg | ORAL_TABLET | Freq: Every evening | ORAL | Status: DC | PRN

## 2012-03-04 MED ORDER — LORAZEPAM 1 MG PO TABS
1.0000 mg | ORAL_TABLET | Freq: Four times a day (QID) | ORAL | Status: DC | PRN
  Administered 2012-03-05 (×2): 1 mg via ORAL
  Filled 2012-03-04 (×3): qty 1

## 2012-03-04 MED ORDER — ACETAMINOPHEN 325 MG PO TABS: 650.0000 mg | ORAL_TABLET | ORAL | Status: DC | PRN

## 2012-03-04 MED ORDER — VITAMIN B-1 100 MG PO TABS
100.0000 mg | ORAL_TABLET | Freq: Every day | ORAL | Status: DC
  Administered 2012-03-05: 100 mg via ORAL
  Filled 2012-03-04: qty 1

## 2012-03-04 MED ORDER — ALUM & MAG HYDROXIDE-SIMETH 200-200-20 MG/5ML PO SUSP
30.0000 mL | ORAL | Status: DC | PRN
  Administered 2012-03-05 (×2): 30 mL via ORAL
  Filled 2012-03-04 (×2): qty 30

## 2012-03-04 MED ORDER — POTASSIUM CHLORIDE 10 MEQ/100ML IV SOLN: 10.0000 meq | Freq: Once | INTRAVENOUS | Status: DC

## 2012-03-04 MED ORDER — LORAZEPAM 1 MG PO TABS
0.0000 mg | ORAL_TABLET | Freq: Two times a day (BID) | ORAL | Status: DC
  Administered 2012-03-05: 1 mg via ORAL

## 2012-03-04 MED ORDER — POTASSIUM CHLORIDE CRYS ER 20 MEQ PO TBCR
40.0000 meq | EXTENDED_RELEASE_TABLET | Freq: Once | ORAL | Status: AC
  Administered 2012-03-04: 40 meq via ORAL
  Filled 2012-03-04: qty 2

## 2012-03-04 MED ORDER — LORAZEPAM 2 MG/ML IJ SOLN: 1.0000 mg | Freq: Four times a day (QID) | INTRAMUSCULAR | Status: DC | PRN

## 2012-03-04 NOTE — ED Notes (Signed)
CRITICAL VALUE ALERT  Critical value received:  Potassium 2.7  Date of notification: 03/04/2012  Time of notification: 2045  Critical value read back:yes  Nurse who received alert:  Cassie Freer, RN  MD notified: Norm Parcel. PAC

## 2012-03-04 NOTE — ED Notes (Signed)
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 Pt reports drinking approx 1 gallon of liquor per week, pt denies any drug use. Pt's last drink was a few hours PTA. Pt in no acute distress, resting comfortably on bed - A&Ox4 pleasant and cooperative. Friend at bedside x1.

## 2012-03-04 NOTE — ED Notes (Signed)
The pt wants detox from alcohol.  His last drink was 2-3 hours ago

## 2012-03-04 NOTE — ED Provider Notes (Signed)
History     CSN: [id number]  Arrival date & time 03/04/12  1919   First MD Initiated Contact with Patient 03/04/12 1946      Chief Complaint  Patient presents with  . detox from alcohol     (Consider location/radiation/quality/duration/timing/severity/associated sxs/prior treatment) HPI Comments: Patient presents today requesting detox from alcohol.  He reports that he drinks a half a gallon of liquor during the course of three days.  He drinks every single day.  Last alcoholic drink was 2 hours prior to arrival in the ED.  He has gone through alcohol withdrawal in the past, but has never gone through any type of formal rehabilitation program.  He denies any nausea or vomiting.  No chest pain.  No tremors.  He denies any recreational drug use.  No SI or HI.    The history is provided by the patient.    Past Medical History  Diagnosis Date  . Fatty liver   . Anemia   . Elevated liver enzymes   . GERD (gastroesophageal reflux disease)   . Alcohol abuse     Past Surgical History  Procedure Date  . No past surgeries     Family History  Problem Relation Age of Onset  . Colon cancer Neg Hx   . Diabetes Father   . Diabetes Paternal Aunt   . Testicular cancer Father     History  Substance Use Topics  . Smoking status: Former Smoker -- 6 years    Types: Cigarettes    Quit date: 06/21/2011  . Smokeless tobacco: Never Used  . Alcohol Use: Yes     1-2 occ.      Review of Systems  Constitutional: Negative for fever and chills.  Respiratory: Negative for shortness of breath.   Cardiovascular: Negative for chest pain.  Gastrointestinal: Negative for nausea, vomiting and abdominal pain.  Neurological: Negative for dizziness, syncope and light-headedness.  Psychiatric/Behavioral: Negative for suicidal ideas, hallucinations and confusion.  All other systems reviewed and are negative.    Allergies  Review of patient's allergies indicates no known allergies.  Home  Medications   Current Outpatient Rx  Name Route Sig Dispense Refill  . PREDNISONE 10 MG PO TABS Oral Take 10 mg by mouth 2 (two) times daily.      BP 136/93  Pulse 125  Temp 98.8 F (37.1 C) (Oral)  Resp 18  SpO2 95%  Physical Exam  Nursing note and vitals reviewed. Constitutional: He appears well-developed and well-nourished. No distress.  HENT:  Head: Normocephalic and atraumatic.  Mouth/Throat: Oropharynx is clear and moist.  Eyes: Pupils are equal, round, and reactive to light. Right eye exhibits nystagmus. Left eye exhibits nystagmus.  Neck: Normal range of motion. Neck supple.  Cardiovascular: Normal rate, regular rhythm and normal heart sounds.   Pulmonary/Chest: Effort normal and breath sounds normal.  Abdominal: Soft. Bowel sounds are normal. He exhibits no distension. There is no tenderness.  Musculoskeletal: Normal range of motion.  Neurological: He is alert.  Skin: Skin is warm and dry. He is not diaphoretic.  Psychiatric: He has a normal mood and affect.    ED Course  Procedures (including critical care time)  Labs Reviewed  URINALYSIS, ROUTINE W REFLEX MICROSCOPIC - Abnormal; Notable for the following:    Specific Gravity, Urine 1.004 (*)     All other components within normal limits  CBC WITH DIFFERENTIAL - Abnormal; Notable for the following:    Basophils Relative 2 (*)  All other components within normal limits  COMPREHENSIVE METABOLIC PANEL - Abnormal; Notable for the following:    Potassium 2.7 (*)     Chloride 94 (*)     Glucose, Bld 123 (*)     BUN 5 (*)     AST 699 (*)     ALT 220 (*)     Alkaline Phosphatase 146 (*)     Total Bilirubin 2.6 (*)     All other components within normal limits  ETHANOL - Abnormal; Notable for the following:    Alcohol, Ethyl (B) 398 (*)     All other components within normal limits  URINE RAPID DRUG SCREEN (HOSP PERFORMED)   No results found.   No diagnosis found.  Discussed with ACT team who reports  that they will come see the patient.   Date: 03/05/2012  Rate: 58  Rhythm: normal sinus rhythm  QRS Axis: normal  Intervals: normal  ST/T Wave abnormalities: nonspecific T wave changes  Conduction Disutrbances:none  Narrative Interpretation:   Old EKG Reviewed: none available    MDM  Patient presenting today for alcohol detox.  He denies any SI or HI.  Patient placed on CIWA protocol.  Patient discussed with ACT team who evaluated patient.  Patient also found to be hypokalemic.  No changes of hypokalemia seen on EKG.  Hypokalemia most likely caused by malnutrition.  Patient given IV Potassium and oral potassium while in the ED.  Oral potassium BID ordered for patient.        Pascal Lux Templeton, PA-C 03/05/12 1114

## 2012-03-05 ENCOUNTER — Inpatient Hospital Stay (HOSPITAL_COMMUNITY)
Admission: EM | Admit: 2012-03-05 | Discharge: 2012-03-09 | DRG: 897 | Disposition: A | Discharge status: Home / Self Care | Payer: Medicaid Other | Source: Ambulatory Visit | Attending: Psychiatry | Admitting: Psychiatry

## 2012-03-05 ENCOUNTER — Encounter (HOSPITAL_COMMUNITY): Payer: Self-pay | Admitting: *Deleted

## 2012-03-05 DIAGNOSIS — F1010 Alcohol abuse, uncomplicated: Secondary | ICD-10-CM

## 2012-03-05 DIAGNOSIS — K219 Gastro-esophageal reflux disease without esophagitis: Secondary | ICD-10-CM | POA: Diagnosis present

## 2012-03-05 DIAGNOSIS — IMO0002 Reserved for concepts with insufficient information to code with codable children: Secondary | ICD-10-CM

## 2012-03-05 DIAGNOSIS — F1020 Alcohol dependence, uncomplicated: Secondary | ICD-10-CM | POA: Diagnosis present

## 2012-03-05 DIAGNOSIS — F1994 Other psychoactive substance use, unspecified with psychoactive substance-induced mood disorder: Principal | ICD-10-CM | POA: Diagnosis present

## 2012-03-05 DIAGNOSIS — K700 Alcoholic fatty liver: Secondary | ICD-10-CM | POA: Diagnosis present

## 2012-03-05 MED ORDER — LOPERAMIDE HCL 2 MG PO CAPS: 2.0000 mg | ORAL_CAPSULE | ORAL | Status: AC | PRN

## 2012-03-05 MED ORDER — ACETAMINOPHEN 325 MG PO TABS: 650.0000 mg | ORAL_TABLET | Freq: Four times a day (QID) | ORAL | Status: DC | PRN

## 2012-03-05 MED ORDER — LORAZEPAM 1 MG PO TABS
1.0000 mg | ORAL_TABLET | Freq: Two times a day (BID) | ORAL | Status: DC
  Filled 2012-03-05: qty 1

## 2012-03-05 MED ORDER — LORAZEPAM 1 MG PO TABS
1.0000 mg | ORAL_TABLET | Freq: Three times a day (TID) | ORAL | Status: AC
  Administered 2012-03-07 – 2012-03-08 (×5): 1 mg via ORAL
  Filled 2012-03-05 (×5): qty 1

## 2012-03-05 MED ORDER — HYDROXYZINE HCL 25 MG PO TABS: 25.0000 mg | ORAL_TABLET | Freq: Four times a day (QID) | ORAL | Status: AC | PRN

## 2012-03-05 MED ORDER — MAGNESIUM HYDROXIDE 400 MG/5ML PO SUSP: 30.0000 mL | Freq: Every day | ORAL | Status: DC | PRN

## 2012-03-05 MED ORDER — ADULT MULTIVITAMIN W/MINERALS CH
1.0000 | ORAL_TABLET | Freq: Every day | ORAL | Status: DC
  Administered 2012-03-06 – 2012-03-09 (×4): 1 via ORAL
  Filled 2012-03-05 (×5): qty 1

## 2012-03-05 MED ORDER — THIAMINE HCL 100 MG/ML IJ SOLN: 100.0000 mg | Freq: Once | INTRAMUSCULAR | Status: DC

## 2012-03-05 MED ORDER — LORAZEPAM 1 MG PO TABS
1.0000 mg | ORAL_TABLET | Freq: Four times a day (QID) | ORAL | Status: AC
  Administered 2012-03-05 – 2012-03-06 (×4): 1 mg via ORAL
  Filled 2012-03-05 (×4): qty 1

## 2012-03-05 MED ORDER — LORAZEPAM 1 MG PO TABS: 1.0000 mg | ORAL_TABLET | Freq: Three times a day (TID) | ORAL | Status: DC | PRN

## 2012-03-05 MED ORDER — PREDNISONE 10 MG PO TABS
10.0000 mg | ORAL_TABLET | Freq: Once | ORAL | Status: AC
  Administered 2012-03-05: 10 mg via ORAL

## 2012-03-05 MED ORDER — LORAZEPAM 1 MG PO TABS: 1.0000 mg | ORAL_TABLET | Freq: Once | ORAL | Status: DC

## 2012-03-05 MED ORDER — ALUM & MAG HYDROXIDE-SIMETH 200-200-20 MG/5ML PO SUSP: 30.0000 mL | ORAL | Status: DC | PRN

## 2012-03-05 MED ORDER — LORAZEPAM 1 MG PO TABS: 1.0000 mg | ORAL_TABLET | Freq: Two times a day (BID) | ORAL | Status: DC

## 2012-03-05 MED ORDER — LORAZEPAM 1 MG PO TABS: 1.0000 mg | ORAL_TABLET | Freq: Four times a day (QID) | ORAL | Status: DC | PRN

## 2012-03-05 MED ORDER — ONDANSETRON 4 MG PO TBDP: 4.0000 mg | ORAL_TABLET | Freq: Four times a day (QID) | ORAL | Status: AC | PRN

## 2012-03-05 MED ORDER — VITAMIN B-1 100 MG PO TABS
100.0000 mg | ORAL_TABLET | Freq: Every day | ORAL | Status: DC
  Administered 2012-03-06 – 2012-03-09 (×4): 100 mg via ORAL
  Filled 2012-03-05 (×5): qty 1

## 2012-03-05 MED ORDER — LORAZEPAM 1 MG PO TABS: 1.0000 mg | ORAL_TABLET | Freq: Four times a day (QID) | ORAL | Status: DC

## 2012-03-05 MED ORDER — PREDNISONE 10 MG PO TABS
10.0000 mg | ORAL_TABLET | Freq: Two times a day (BID) | ORAL | Status: DC
  Administered 2012-03-06 – 2012-03-09 (×7): 10 mg via ORAL
  Filled 2012-03-05 (×10): qty 1

## 2012-03-05 MED ORDER — LORAZEPAM 1 MG PO TABS: 1.0000 mg | ORAL_TABLET | Freq: Four times a day (QID) | ORAL | Status: AC | PRN

## 2012-03-05 NOTE — BH Assessment (Signed)
Assessment Note   Troy Foster is an 36 y.o. male.  Troy Foster came to [facility] with girlfriend.  Troy Foster is seeking detox from ETOH.  Troy Foster reports drinking 1/2 gallon of vodka during a 2-3 day period regularly with daily use.  Troy Foster has been drinking at this rate for years.  Last use was 07/04 in the PM and was four drinks.  Troy Foster denies any HI, SI or A/V hallucinations.  Troy Foster did say Troy Foster has a court date on 07/18 but believes the case (probation violation) will be continued.  Troy Foster has not had any prior SA or MH tx inpatient or out patient. 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 Fatty liver   . Anemia   . Elevated liver enzymes   . GERD (gastroesophageal reflux disease)   . Alcohol abuse    Axis IV: other psychosocial or environmental problems and problems related to legal system/crime Axis V: 31-40 impairment in reality testing  Past Medical History:  Past Medical History  Diagnosis Date  . Fatty liver   . Anemia   . Elevated liver enzymes   . GERD (gastroesophageal reflux disease)   . Alcohol abuse     Past Surgical History  Procedure Date  . No past surgeries     Family History:  Family History  Problem Relation Age of Onset  . Colon cancer Neg Hx   . Diabetes Father   . Diabetes Paternal Aunt   . Testicular cancer Father     Social History:  reports that Troy Foster quit smoking about 8 months ago. His smoking use included Cigarettes. Troy Foster quit after 6 years of use. Troy Foster has never used smokeless tobacco. Troy Foster reports that Troy Foster drinks alcohol. Troy Foster reports that Troy Foster does not use illicit drugs.  Additional Social History:  Alcohol / Drug Use Pain Medications: None Prescriptions: None Over the Counter: None Negative Consequences of Use: Personal relationships Withdrawal Symptoms: Agitation;Weakness;Sweats;Fever / Chills;Tachycardia;Tremors;Seizures Onset of Seizures: Detox seizures Date of most recent seizure: Two & half weeks ago when pt  attempted to quit alone.  CIWA: CIWA-Ar BP: 121/86 mmHg Pulse Rate: 73  Nausea and Vomiting: no nausea and no vomiting Tactile Disturbances: none Tremor: two Auditory Disturbances: not present Paroxysmal Sweats: no sweat visible Visual Disturbances: not present Anxiety: mildly anxious Headache, Fullness in Head: mild Agitation: normal activity Orientation and Clouding of Sensorium: oriented and can do serial additions CIWA-Ar Total: 5  COWS:    Allergies: No Known Allergies  Home Medications:  (Not in a hospital admission)  OB/GYN Status:  No LMP for male patient.  General Assessment Data Location of Assessment: [facility] ED Living Arrangements: Spouse/significant other Can pt return to current living arrangement?: Yes Admission Status: Voluntary Is patient capable of signing voluntary admission?: Yes Transfer from: Acute Hospital Referral Source: Self/Family/Friend  Education Status Highest grade of school patient has completed: College degree  Risk to self Suicidal Ideation: No Suicidal Intent: No Is patient at risk for suicide?: No Suicidal Plan?: No Access to Means: No What has been your use of drugs/alcohol within the last 12 months?: Daily use of vodka Previous Attempts/Gestures: No How many times?: 0  Other Self Harm Risks: None Triggers for Past Attempts: None known Intentional Self Injurious Behavior: None Family Suicide History: No Recent stressful life event(s): Other (Comment) (None mentioned or thought of) Persecutory voices/beliefs?: No Depression: No Depression Symptoms:  (No reported depression) Substance abuse history and/or treatment for substance  abuse?: No Suicide prevention information given to non-admitted patients: Not applicable  Risk to Others Homicidal Ideation: No Thoughts of Harm to Others: No Current Homicidal Intent: No Current Homicidal Plan: No Access to Homicidal Means: No Identified Victim: No one History of harm to others?:  No Assessment of Violence: None Noted Violent Behavior Description: None noted.  Pt calm and cooperative Does patient have access to weapons?: Yes (Comment) (Pt hunts and has guns, knives) Criminal Charges Pending?: Yes Describe Pending Criminal Charges: Probation violation Does patient have a court date: Yes (May also get a continuance) Court Date: 03/18/12  Psychosis Hallucinations: None noted Delusions: None noted  Mental Status Report Appear/Hygiene:  (Casual) Eye Contact: Good Motor Activity: Freedom of movement;Unremarkable Speech: Logical/coherent Level of Consciousness: Quiet/awake Mood: Other (Comment) (Patient, quiet) Affect: Appropriate to circumstance Anxiety Level: None Thought Processes: Coherent;Relevant Judgement: Unimpaired Orientation: Place;Person;Time;Situation Obsessive Compulsive Thoughts/Behaviors: None  Cognitive Functioning Concentration: Normal Memory: Recent Intact;Remote Intact IQ: Average Insight: Good Impulse Control: Poor Appetite: Poor Weight Loss: 25  Weight Gain: 0  Sleep: Decreased Total Hours of Sleep:  (3-4 H/D) Vegetative Symptoms: None  ADLScreening [facility]) Patient's cognitive ability adequate to safely complete daily activities?: Yes Patient able to express need for assistance with ADLs?: Yes Independently performs ADLs?: Yes  Abuse/Neglect [facility]) Physical Abuse: Denies Verbal Abuse: Denies Sexual Abuse: Denies  Prior Inpatient Therapy Prior Inpatient Therapy: No Prior Therapy Dates: N/A Prior Therapy Facilty/Provider(s): N/A Reason for Treatment: None  Prior Outpatient Therapy Prior Outpatient Therapy: No Prior Therapy Dates: None Prior Therapy Facilty/Provider(s): None Reason for Treatment: None  ADL Screening (condition at time of admission) Patient's cognitive ability adequate to safely complete daily activities?: Yes Patient able to express need for assistance with ADLs?: Yes Independently  performs ADLs?: Yes Weakness of Legs: None Weakness of Arms/Hands: None  Home Assistive Devices/Equipment Home Assistive Devices/Equipment: None    Abuse/Neglect Assessment (Assessment to be complete while patient is alone) Physical Abuse: Denies Verbal Abuse: Denies Sexual Abuse: Denies Exploitation of patient/patient's resources: Denies Self-Neglect: Denies Values / Beliefs Cultural Requests During Hospitalization: None Spiritual Requests During Hospitalization: None   Advance Directives (For Healthcare) Advance Directive: Patient does not have advance directive;Patient would not like information    Additional Information 1:1 In Past 12 Months?: No CIRT Risk: No Elopement Risk: No Does patient have medical clearance?: Yes     Disposition:  Disposition Disposition of Patient: Inpatient treatment program;Referred to Type of inpatient treatment program: Adult Patient referred to: [facility]  On Site Evaluation by:   Reviewed with Physician:     Beatriz Stallion Ray 03/05/2012 5:09 AM

## 2012-03-05 NOTE — ED Notes (Signed)
Report given to Wayne RN

## 2012-03-05 NOTE — ED Notes (Signed)
Received report from [employer]. Pt is resting. Wife at bedside. Pt is not having any respiratory or cardiac distress. No pain at this time. No SI/Hi. CIAWR at this time is 0. Will continue to monitor.

## 2012-03-05 NOTE — ED Provider Notes (Signed)
Pt seen and examined in Pod C.  He has no complaints with the exception of mild shakiness- his last CIWAA score was 6, he has been getting ativan on ciwaa protocol which he states does help with the symptoms.  Awaiting dispo per ACT team.    3:54 PM pt has been accepted at [facility]- potassium improved.    Ethelda Chick, MD 03/05/12 [phone]

## 2012-03-05 NOTE — ED Notes (Signed)
EKG captured and copy given to Dr. Anitra Lauth. Dr. Anitra Lauth cleared patient to be transported to [facility].

## 2012-03-05 NOTE — ED Notes (Signed)
Pt transported to [facility]

## 2012-03-05 NOTE — ED Notes (Signed)
ACT Team at bedside.  

## 2012-03-05 NOTE — ED Provider Notes (Signed)
Medical screening examination/treatment/procedure(s) were performed by non-physician practitioner and as supervising physician I was immediately available for consultation/collaboration.   Benny Lennert, MD 03/05/12 [phone]

## 2012-03-05 NOTE — BH Assessment (Signed)
[facility] Assessment Progress Note      1500:  Pt has been accepted for inpatient treatment to Dr. Marlowe [facility].  Dr and nursing staff notified and agreeable with transfer.  All paperwork faxed to appropriate parties and transfer initiated.  Verdene Lennert Hailynn Slovacek, LPC

## 2012-03-06 DIAGNOSIS — F1010 Alcohol abuse, uncomplicated: Secondary | ICD-10-CM

## 2012-03-06 DIAGNOSIS — F1994 Other psychoactive substance use, unspecified with psychoactive substance-induced mood disorder: Principal | ICD-10-CM | POA: Diagnosis present

## 2012-03-06 MED ORDER — PANTOPRAZOLE SODIUM 20 MG PO TBEC
20.0000 mg | DELAYED_RELEASE_TABLET | Freq: Two times a day (BID) | ORAL | Status: DC
  Administered 2012-03-06 – 2012-03-09 (×6): 20 mg via ORAL
  Filled 2012-03-06 (×8): qty 1

## 2012-03-06 MED ORDER — LORAZEPAM 1 MG PO TABS
1.0000 mg | ORAL_TABLET | Freq: Once | ORAL | Status: AC
  Administered 2012-03-06: 1 mg via ORAL
  Filled 2012-03-06: qty 1

## 2012-03-06 NOTE — [facility] Suicide Risk Assessment (Signed)
Suicide Risk Assessment  Admission Assessment     Demographic factors:  Assessment Details Time of Assessment: Admission Information Obtained From: Patient Current Mental Status:  Current Mental Status:  (Denies) Loss Factors:  Loss Factors: Decline in physical health Historical Factors:  Historical Factors:  (None-Detox issues only) Risk Reduction Factors:  Risk Reduction Factors: Sense of responsibility to family;Living with another person, especially a relative;Positive social support;Positive therapeutic relationship  CLINICAL FACTORS:   Depression:   Anhedonia Comorbid alcohol abuse/dependence Alcohol/Substance Abuse/Dependencies Previous Psychiatric Diagnoses and Treatments  COGNITIVE FEATURES THAT CONTRIBUTE TO RISK:  Thought constriction (tunnel vision)    SUICIDE RISK:   Moderate:  Frequent suicidal ideation with limited intensity, and duration, some specificity in terms of plans, no associated intent, good self-control, limited dysphoria/symptomatology, some risk factors present, and identifiable protective factors, including available and accessible social support.  Reason for hospitalization: .detox from alcohol  Diagnosis:   Axis I: Alcohol Abuse and Substance Induced Mood Disorder Axis II: Deferred Axis III:  Past Medical History  Diagnosis Date  . Fatty liver   . Anemia   . Elevated liver enzymes   . GERD (gastroesophageal reflux disease)   . Alcohol abuse    Axis IV: other psychosocial or environmental problems Axis V: 51-60 moderate symptoms  ADL's:  Intact  Sleep: Good  Appetite:  Fair  Suicidal Ideation:  Pt denies any thoughts, plans, intent of suicide Homicidal Ideation:  Pt denies any thoughts, plans, intent of homicide  Mental Status Examination/Evaluation: Objective:  Appearance: Casual  Eye Contact::  Good  Speech:  Clear and Coherent  Volume:  Normal  Mood:  Euthymic  Affect:  Congruent  Thought Process:  Coherent  Orientation:   Full  Thought Content:  WDL  Suicidal Thoughts:  No  Homicidal Thoughts:  No  Memory:  Immediate;   Fair Recent;   Fair Remote;   Fair  Judgement:  Impaired  Insight:  Fair  Psychomotor Activity:  Normal  Concentration:  Fair  Recall:  Fair  Akathisia:  No  Handed:  Right  AIMS (if indicated):     Assets:  Communication Skills Desire for Improvement  Sleep:  Number of Hours: 5.75    Vital Signs:Blood pressure 128/92, pulse 67, temperature 97.6 F (36.4 C), temperature source Oral, resp. rate 16. Current Medications: Current Facility-Administered Medications  Medication Dose Route Frequency Provider Last Rate Last Dose  . alum & mag hydroxide-simeth (MAALOX/MYLANTA) 200-200-20 MG/5ML suspension 30 mL  30 mL Oral Q4H PRN Verne Spurr, PA-C      . hydrOXYzine (ATARAX/VISTARIL) tablet 25 mg  25 mg Oral Q6H PRN Curlene Labrum Readling, MD      . loperamide (IMODIUM) capsule 2-4 mg  2-4 mg Oral PRN Curlene Labrum Readling, MD      . LORazepam (ATIVAN) tablet 1 mg  1 mg Oral Q6H Curlene Labrum Readling, MD   1 mg at 03/06/12 1208  . LORazepam (ATIVAN) tablet 1 mg  1 mg Oral Q8H Randy D Readling, MD      . LORazepam (ATIVAN) tablet 1 mg  1 mg Oral Q12H Randy D Readling, MD      . LORazepam (ATIVAN) tablet 1 mg  1 mg Oral Once Curlene Labrum Readling, MD      . LORazepam (ATIVAN) tablet 1 mg  1 mg Oral Q6H PRN Curlene Labrum Readling, MD      . magnesium hydroxide (MILK OF MAGNESIA) suspension 30 mL  30 mL Oral Daily PRN Verne Spurr, PA-C      .  multivitamin with minerals tablet 1 tablet  1 tablet Oral Daily Ronny Bacon, MD   1 tablet at 03/06/12 0826  . ondansetron (ZOFRAN-ODT) disintegrating tablet 4 mg  4 mg Oral Q6H PRN Curlene Labrum Readling, MD      . pantoprazole (PROTONIX) EC tablet 20 mg  20 mg Oral BID AC Mike Craze, MD      . predniSONE (DELTASONE) tablet 10 mg  10 mg Oral BID WC Curlene Labrum Readling, MD   10 mg at 03/06/12 0826  . predniSONE (DELTASONE) tablet 10 mg  10 mg Oral Once Ronny Bacon, MD   10  mg at 03/05/12 2300  . thiamine (B-1) injection 100 mg  100 mg Intramuscular Once Curlene Labrum Readling, MD      . thiamine (VITAMIN B-1) tablet 100 mg  100 mg Oral Daily Curlene Labrum Readling, MD   100 mg at 03/06/12 0826  . DISCONTD: acetaminophen (TYLENOL) tablet 650 mg  650 mg Oral Q6H PRN Verne Spurr, PA-C      . DISCONTD: LORazepam (ATIVAN) tablet 1 mg  1 mg Oral Q6H PRN Ronny Bacon, MD       Facility-Administered Medications Ordered in Other Encounters  Medication Dose Route Frequency Provider Last Rate Last Dose  . DISCONTD: acetaminophen (TYLENOL) tablet 650 mg  650 mg Oral Q4H PRN Pascal Lux Wingen, PA-C      . DISCONTD: alum & mag hydroxide-simeth (MAALOX/MYLANTA) 200-200-20 MG/5ML suspension 30 mL  30 mL Oral PRN Pascal Lux Wingen, PA-C   30 mL at 03/05/12 1646  . DISCONTD: folic acid (FOLVITE) tablet 1 mg  1 mg Oral Daily Pascal Lux Wingen, PA-C   1 mg at 03/05/12 1055  . DISCONTD: ibuprofen (ADVIL,MOTRIN) tablet 600 mg  600 mg Oral Q8H PRN Pascal Lux Wingen, PA-C      . DISCONTD: LORazepam (ATIVAN) injection 1 mg  1 mg Intravenous Q6H PRN Pascal Lux Wingen, PA-C      . DISCONTD: LORazepam (ATIVAN) tablet 0-4 mg  0-4 mg Oral Q6H Heather Van Wingen, PA-C   1 mg at 03/05/12 1605  . DISCONTD: LORazepam (ATIVAN) tablet 0-4 mg  0-4 mg Oral Q12H Heather Van Wingen, PA-C   1 mg at 03/05/12 1110  . DISCONTD: LORazepam (ATIVAN) tablet 1 mg  1 mg Oral Q6H PRN Pascal Lux Wingen, PA-C   1 mg at 03/05/12 1557  . DISCONTD: LORazepam (ATIVAN) tablet 1 mg  1 mg Oral Q8H PRN Pascal Lux Wingen, PA-C      . DISCONTD: LORazepam (ATIVAN) tablet 1 mg  1 mg Oral QID Verne Spurr, PA-C      . DISCONTD: LORazepam (ATIVAN) tablet 1 mg  1 mg Oral TID PRN Verne Spurr, PA-C      . DISCONTD: LORazepam (ATIVAN) tablet 1 mg  1 mg Oral BID Verne Spurr, PA-C      . DISCONTD: multivitamin with minerals tablet 1 tablet  1 tablet Oral Daily Pascal Lux Wingen, PA-C   1 tablet at 03/05/12 1055  . DISCONTD:  ondansetron (ZOFRAN) tablet 4 mg  4 mg Oral Q8H PRN Pascal Lux Wingen, PA-C      . DISCONTD: potassium chloride SA (K-DUR,KLOR-CON) CR tablet 20 mEq  20 mEq Oral BID Pascal Lux Wingen, PA-C   20 mEq at 03/05/12 1055  . DISCONTD: thiamine (B-1) injection 100 mg  100 mg Intravenous Daily Pascal Lux Wingen, PA-C   100 mg at 03/04/12 2125  . DISCONTD: thiamine (VITAMIN B-1)  tablet 100 mg  100 mg Oral Daily Pascal Lux Wingen, PA-C   100 mg at 03/05/12 1055  . DISCONTD: zolpidem (AMBIEN) tablet 5 mg  5 mg Oral QHS PRN Magnus Sinning, PA-C        Lab Results:  Results for orders placed during the hospital encounter of 03/04/12 (from the past 48 hour(s))  URINALYSIS, ROUTINE W REFLEX MICROSCOPIC     Status: Abnormal   Collection Time   03/04/12  7:33 PM      Component Value Range Comment   Color, Urine YELLOW  YELLOW    APPearance CLEAR  CLEAR    Specific Gravity, Urine 1.004 (*) 1.005 - 1.030    pH 7.0  5.0 - 8.0    Glucose, UA NEGATIVE  NEGATIVE mg/dL    Hgb urine dipstick NEGATIVE  NEGATIVE    Bilirubin Urine NEGATIVE  NEGATIVE    Ketones, ur NEGATIVE  NEGATIVE mg/dL    Protein, ur NEGATIVE  NEGATIVE mg/dL    Urobilinogen, UA 1.0  0.0 - 1.0 mg/dL    Nitrite NEGATIVE  NEGATIVE    Leukocytes, UA NEGATIVE  NEGATIVE MICROSCOPIC NOT DONE ON URINES WITH NEGATIVE PROTEIN, BLOOD, LEUKOCYTES, NITRITE, OR GLUCOSE <1000 mg/dL.  URINE RAPID DRUG SCREEN (HOSP PERFORMED)     Status: Normal   Collection Time   03/04/12  7:33 PM      Component Value Range Comment   Opiates NONE DETECTED  NONE DETECTED    Cocaine NONE DETECTED  NONE DETECTED    Benzodiazepines NONE DETECTED  NONE DETECTED    Amphetamines NONE DETECTED  NONE DETECTED    Tetrahydrocannabinol NONE DETECTED  NONE DETECTED    Barbiturates NONE DETECTED  NONE DETECTED   CBC WITH DIFFERENTIAL     Status: Abnormal   Collection Time   03/04/12  7:38 PM      Component Value Range Comment   WBC 5.0  4.0 - 10.5 K/uL    RBC 4.53  4.22 - 5.81  MIL/uL    Hemoglobin 14.8  13.0 - 17.0 g/dL    HCT 16.1  09.6 - 04.5 %    MCV 95.1  78.0 - 100.0 fL    MCH 32.7  26.0 - 34.0 pg    MCHC 34.3  30.0 - 36.0 g/dL    RDW 40.9  81.1 - 91.4 %    Platelets 161  150 - 400 K/uL    Neutrophils Relative 49  43 - 77 %    Neutro Abs 2.5  1.7 - 7.7 K/uL    Lymphocytes Relative 40  12 - 46 %    Lymphs Abs 2.0  0.7 - 4.0 K/uL    Monocytes Relative 8  3 - 12 %    Monocytes Absolute 0.4  0.1 - 1.0 K/uL    Eosinophils Relative 2  0 - 5 %    Eosinophils Absolute 0.1  0.0 - 0.7 K/uL    Basophils Relative 2 (*) 0 - 1 %    Basophils Absolute 0.1  0.0 - 0.1 K/uL   COMPREHENSIVE METABOLIC PANEL     Status: Abnormal   Collection Time   03/04/12  7:38 PM      Component Value Range Comment   Sodium 140  135 - 145 mEq/L    Potassium 2.7 (*) 3.5 - 5.1 mEq/L    Chloride 94 (*) 96 - 112 mEq/L    CO2 29  19 - 32 mEq/L    Glucose,  Bld 123 (*) 70 - 99 mg/dL    BUN 5 (*) 6 - 23 mg/dL    Creatinine, Ser 9.14  0.50 - 1.35 mg/dL    Calcium 9.4  8.4 - 78.2 mg/dL    Total Protein 7.8  6.0 - 8.3 g/dL    Albumin 4.1  3.5 - 5.2 g/dL    AST 956 (*) 0 - 37 U/L    ALT 220 (*) 0 - 53 U/L    Alkaline Phosphatase 146 (*) 39 - 117 U/L    Total Bilirubin 2.6 (*) 0.3 - 1.2 mg/dL    GFR calc non Af Amer >90  >90 mL/min    GFR calc Af Amer >90  >90 mL/min   ETHANOL     Status: Abnormal   Collection Time   03/04/12  7:38 PM      Component Value Range Comment   Alcohol, Ethyl (B) 398 (*) 0 - 11 mg/dL   POTASSIUM     Status: Abnormal   Collection Time   03/04/12 10:40 PM      Component Value Range Comment   Potassium 2.7 (*) 3.5 - 5.1 mEq/L   MAGNESIUM     Status: Normal   Collection Time   03/04/12 10:40 PM      Component Value Range Comment   Magnesium 1.6  1.5 - 2.5 mg/dL   ETHANOL     Status: Normal   Collection Time   03/05/12  6:16 AM      Component Value Range Comment   Alcohol, Ethyl (B) <11  0 - 11 mg/dL   POTASSIUM     Status: Normal   Collection Time   03/05/12   1:31 PM      Component Value Range Comment   Potassium 3.6  3.5 - 5.1 mEq/L DELTA CHECK NOTED    Physical Findings: AIMS: Facial and Oral Movements Muscles of Facial Expression: None, normal Lips and Perioral Area: None, normal Jaw: None, normal Tongue: None, normal,Extremity Movements Upper (arms, wrists, hands, fingers): None, normal Lower (legs, knees, ankles, toes): None, normal, Trunk Movements Neck, shoulders, hips: None, normal, Overall Severity Severity of abnormal movements (highest score from questions above): None, normal Incapacitation due to abnormal movements: None, normal Patient's awareness of abnormal movements (rate only patient's report): No Awareness, Dental Status Current problems with teeth and/or dentures?: No Does patient usually wear dentures?: No  CIWA:  CIWA-Ar Total: 3  COWS:  COWS Total Score: 2   Risk: Risk of harm to self is elevated by by his mood and his addiction  Risk of harm to others is minimal in that he has not been involved in fights or had any legal charges filed on him.  Treatment Plan Summary: Daily contact with patient to assess and evaluate symptoms and progress in treatment Medication management No signs/symptoms of withdrawal from abusable substances.  Plan: Admit, start Librium protocol for alcohol detox.  Discussed the risks, benefits, and probable clinical course with and without treatment.  Pt is agreeable to the current course of treatment. We will continue on q. 15 checks the unit protocol. At this time there is no clinical indication for one-to-one observation as patient contract for safety and presents little risk to harm themself and others.  We will increase collateral information. I encourage patient to participate in group milieu therapy. Pt will be seen in treatment team soon for further treatment and appropriate discharge planning. Please see history and physical note for more detailed information ELOS:  3 to 5 days.    Jaecob Lowden 03/06/2012, 2:02 PM

## 2012-03-06 NOTE — Progress Notes (Signed)
Patient ID: Troy Foster, male   DOB: [birth year], 36 y.o.   MRN: [id number] This 36 yo SWM came to [facility] from [facility], where he had gone to request detox from alcohol. Stated he drank 1/2 gal liquor approx. Every 3-4 days, had worked as a Production designer, theatre/television/film of a bar.  BAL was 398 at the ER, and K+ was 2.7, was brought up to 3.6 prior to transfer. States had had a liver bx done by Dr. Dickie La nearly 2 mos ago, has been on prednisone, was told liver was swollen, enzymes extremely elevated. Was started on ativan while in the ED, and orders obtained to continue ativan here at [facility] for detox protocol.  Has been quiet, isolative, but pleasant and cooperative, somewhat anxious, tremulous. Was given ativan 1 mg and a dose of prednisone tonight to put him back on schedule.  Pt reports he has a court date pending, 7/18 for probation violation..  Was oriented to unit, room and rules, has been resting.  Will continue to monitor q 15 minutes for safety.

## 2012-03-06 NOTE — Progress Notes (Signed)
Patient ID: Troy Foster, male   DOB: [birth year], 36 y.o.   MRN: [id number]  Pt. attended and participated in aftercare planning group. Pt. verbally accepted information on suicide prevention, warning signs to look for with suicide and crisis line numbers to use. Pt. listed their current anxiety level as 2 and their current depression level as 1.

## 2012-03-06 NOTE — H&P (Signed)
Medical/psychiatric screening examination/treatment/procedure(s) were performed by non-physician practitioner and as supervising physician I was immediately available for consultation/collaboration.  I have seen and examined this patient and agree the major elements of this evaluation.  

## 2012-03-06 NOTE — Progress Notes (Signed)
Psychoeducational Group Note  Date:  03/06/2012 Time:  1015  Group Topic/Focus:  Identifying Needs:   The focus of this group is to help patients identify their personal needs that have been historically problematic and identify healthy behaviors to address their needs.  Participation Level:  Active  Participation Quality:  Appropriate  Affect:  Appropriate  Cognitive:  Alert  Insight:  Good  Engagement in Group:  Good  Additional Comments:    Dione Housekeeper

## 2012-03-06 NOTE — Progress Notes (Signed)
[facility] MD Progress Note  03/06/2012 1:55 PM  Diagnosis:   Axis I: Alcohol Abuse and Substance Induced Mood Disorder Axis II: Deferred Axis III:  Past Medical History  Diagnosis Date  . Fatty liver   . Anemia   . Elevated liver enzymes   . GERD (gastroesophageal reflux disease)   . Alcohol abuse    Axis IV: other psychosocial or environmental problems Axis V: 51-60 moderate symptoms  ADL's:  Intact  Sleep: Good  Appetite:  Fair  Suicidal Ideation:  Pt denies any thoughts, plans, intent of suicide Homicidal Ideation:  Pt denies any thoughts, plans, intent of homicide  Mental Status Examination/Evaluation: Objective:  Appearance: Casual  Eye Contact::  Good  Speech:  Clear and Coherent  Volume:  Normal  Mood:  Euthymic  Affect:  Congruent  Thought Process:  Coherent  Orientation:  Full  Thought Content:  WDL  Suicidal Thoughts:  No  Homicidal Thoughts:  No  Memory:  Immediate;   Fair Recent;   Fair Remote;   Fair  Judgement:  Impaired  Insight:  Fair  Psychomotor Activity:  Normal  Concentration:  Fair  Recall:  Fair  Akathisia:  No  Handed:  Right  AIMS (if indicated):     Assets:  Communication Skills Desire for Improvement  Sleep:  Number of Hours: 5.75    Vital Signs:Blood pressure 128/92, pulse 67, temperature 97.6 F (36.4 C), temperature source Oral, resp. rate 16. Current Medications: Current Facility-Administered Medications  Medication Dose Route Frequency Provider Last Rate Last Dose  . alum & mag hydroxide-simeth (MAALOX/MYLANTA) 200-200-20 MG/5ML suspension 30 mL  30 mL Oral Q4H PRN Verne Spurr, PA-C      . hydrOXYzine (ATARAX/VISTARIL) tablet 25 mg  25 mg Oral Q6H PRN Curlene Labrum Readling, MD      . loperamide (IMODIUM) capsule 2-4 mg  2-4 mg Oral PRN Curlene Labrum Readling, MD      . LORazepam (ATIVAN) tablet 1 mg  1 mg Oral Q6H Curlene Labrum Readling, MD   1 mg at 03/06/12 1208  . LORazepam (ATIVAN) tablet 1 mg  1 mg Oral Q8H Randy D Readling, MD      .  LORazepam (ATIVAN) tablet 1 mg  1 mg Oral Q12H Randy D Readling, MD      . LORazepam (ATIVAN) tablet 1 mg  1 mg Oral Once Curlene Labrum Readling, MD      . LORazepam (ATIVAN) tablet 1 mg  1 mg Oral Q6H PRN Curlene Labrum Readling, MD      . magnesium hydroxide (MILK OF MAGNESIA) suspension 30 mL  30 mL Oral Daily PRN Verne Spurr, PA-C      . multivitamin with minerals tablet 1 tablet  1 tablet Oral Daily Ronny Bacon, MD   1 tablet at 03/06/12 0826  . ondansetron (ZOFRAN-ODT) disintegrating tablet 4 mg  4 mg Oral Q6H PRN Curlene Labrum Readling, MD      . pantoprazole (PROTONIX) EC tablet 20 mg  20 mg Oral BID AC Mike Craze, MD      . predniSONE (DELTASONE) tablet 10 mg  10 mg Oral BID WC Curlene Labrum Readling, MD   10 mg at 03/06/12 0826  . predniSONE (DELTASONE) tablet 10 mg  10 mg Oral Once Ronny Bacon, MD   10 mg at 03/05/12 2300  . thiamine (B-1) injection 100 mg  100 mg Intramuscular Once Curlene Labrum Readling, MD      . thiamine (VITAMIN B-1) tablet 100  mg  100 mg Oral Daily Curlene Labrum Readling, MD   100 mg at 03/06/12 0826  . DISCONTD: acetaminophen (TYLENOL) tablet 650 mg  650 mg Oral Q6H PRN Verne Spurr, PA-C      . DISCONTD: LORazepam (ATIVAN) tablet 1 mg  1 mg Oral Q6H PRN Ronny Bacon, MD       Facility-Administered Medications Ordered in Other Encounters  Medication Dose Route Frequency Provider Last Rate Last Dose  . DISCONTD: acetaminophen (TYLENOL) tablet 650 mg  650 mg Oral Q4H PRN Pascal Lux Wingen, PA-C      . DISCONTD: alum & mag hydroxide-simeth (MAALOX/MYLANTA) 200-200-20 MG/5ML suspension 30 mL  30 mL Oral PRN Pascal Lux Wingen, PA-C   30 mL at 03/05/12 1646  . DISCONTD: folic acid (FOLVITE) tablet 1 mg  1 mg Oral Daily Pascal Lux Wingen, PA-C   1 mg at 03/05/12 1055  . DISCONTD: ibuprofen (ADVIL,MOTRIN) tablet 600 mg  600 mg Oral Q8H PRN Pascal Lux Wingen, PA-C      . DISCONTD: LORazepam (ATIVAN) injection 1 mg  1 mg Intravenous Q6H PRN Pascal Lux Wingen, PA-C      . DISCONTD:  LORazepam (ATIVAN) tablet 0-4 mg  0-4 mg Oral Q6H Heather Van Wingen, PA-C   1 mg at 03/05/12 1605  . DISCONTD: LORazepam (ATIVAN) tablet 0-4 mg  0-4 mg Oral Q12H Heather Van Wingen, PA-C   1 mg at 03/05/12 1110  . DISCONTD: LORazepam (ATIVAN) tablet 1 mg  1 mg Oral Q6H PRN Pascal Lux Wingen, PA-C   1 mg at 03/05/12 1557  . DISCONTD: LORazepam (ATIVAN) tablet 1 mg  1 mg Oral Q8H PRN Pascal Lux Wingen, PA-C      . DISCONTD: LORazepam (ATIVAN) tablet 1 mg  1 mg Oral QID Verne Spurr, PA-C      . DISCONTD: LORazepam (ATIVAN) tablet 1 mg  1 mg Oral TID PRN Verne Spurr, PA-C      . DISCONTD: LORazepam (ATIVAN) tablet 1 mg  1 mg Oral BID Verne Spurr, PA-C      . DISCONTD: multivitamin with minerals tablet 1 tablet  1 tablet Oral Daily Pascal Lux Wingen, PA-C   1 tablet at 03/05/12 1055  . DISCONTD: ondansetron (ZOFRAN) tablet 4 mg  4 mg Oral Q8H PRN Pascal Lux Wingen, PA-C      . DISCONTD: potassium chloride SA (K-DUR,KLOR-CON) CR tablet 20 mEq  20 mEq Oral BID Pascal Lux Wingen, PA-C   20 mEq at 03/05/12 1055  . DISCONTD: thiamine (B-1) injection 100 mg  100 mg Intravenous Daily Pascal Lux Wingen, PA-C   100 mg at 03/04/12 2125  . DISCONTD: thiamine (VITAMIN B-1) tablet 100 mg  100 mg Oral Daily Pascal Lux Wingen, PA-C   100 mg at 03/05/12 1055  . DISCONTD: zolpidem (AMBIEN) tablet 5 mg  5 mg Oral QHS PRN Magnus Sinning, PA-C        Lab Results:  Results for orders placed during the hospital encounter of 03/04/12 (from the past 48 hour(s))  URINALYSIS, ROUTINE W REFLEX MICROSCOPIC     Status: Abnormal   Collection Time   03/04/12  7:33 PM      Component Value Range Comment   Color, Urine YELLOW  YELLOW    APPearance CLEAR  CLEAR    Specific Gravity, Urine 1.004 (*) 1.005 - 1.030    pH 7.0  5.0 - 8.0    Glucose, UA NEGATIVE  NEGATIVE mg/dL    Hgb urine  dipstick NEGATIVE  NEGATIVE    Bilirubin Urine NEGATIVE  NEGATIVE    Ketones, ur NEGATIVE  NEGATIVE mg/dL    Protein, ur NEGATIVE   NEGATIVE mg/dL    Urobilinogen, UA 1.0  0.0 - 1.0 mg/dL    Nitrite NEGATIVE  NEGATIVE    Leukocytes, UA NEGATIVE  NEGATIVE MICROSCOPIC NOT DONE ON URINES WITH NEGATIVE PROTEIN, BLOOD, LEUKOCYTES, NITRITE, OR GLUCOSE <1000 mg/dL.  URINE RAPID DRUG SCREEN (HOSP PERFORMED)     Status: Normal   Collection Time   03/04/12  7:33 PM      Component Value Range Comment   Opiates NONE DETECTED  NONE DETECTED    Cocaine NONE DETECTED  NONE DETECTED    Benzodiazepines NONE DETECTED  NONE DETECTED    Amphetamines NONE DETECTED  NONE DETECTED    Tetrahydrocannabinol NONE DETECTED  NONE DETECTED    Barbiturates NONE DETECTED  NONE DETECTED   CBC WITH DIFFERENTIAL     Status: Abnormal   Collection Time   03/04/12  7:38 PM      Component Value Range Comment   WBC 5.0  4.0 - 10.5 K/uL    RBC 4.53  4.22 - 5.81 MIL/uL    Hemoglobin 14.8  13.0 - 17.0 g/dL    HCT 16.1  09.6 - 04.5 %    MCV 95.1  78.0 - 100.0 fL    MCH 32.7  26.0 - 34.0 pg    MCHC 34.3  30.0 - 36.0 g/dL    RDW 40.9  81.1 - 91.4 %    Platelets 161  150 - 400 K/uL    Neutrophils Relative 49  43 - 77 %    Neutro Abs 2.5  1.7 - 7.7 K/uL    Lymphocytes Relative 40  12 - 46 %    Lymphs Abs 2.0  0.7 - 4.0 K/uL    Monocytes Relative 8  3 - 12 %    Monocytes Absolute 0.4  0.1 - 1.0 K/uL    Eosinophils Relative 2  0 - 5 %    Eosinophils Absolute 0.1  0.0 - 0.7 K/uL    Basophils Relative 2 (*) 0 - 1 %    Basophils Absolute 0.1  0.0 - 0.1 K/uL   COMPREHENSIVE METABOLIC PANEL     Status: Abnormal   Collection Time   03/04/12  7:38 PM      Component Value Range Comment   Sodium 140  135 - 145 mEq/L    Potassium 2.7 (*) 3.5 - 5.1 mEq/L    Chloride 94 (*) 96 - 112 mEq/L    CO2 29  19 - 32 mEq/L    Glucose, Bld 123 (*) 70 - 99 mg/dL    BUN 5 (*) 6 - 23 mg/dL    Creatinine, Ser 7.82  0.50 - 1.35 mg/dL    Calcium 9.4  8.4 - 95.6 mg/dL    Total Protein 7.8  6.0 - 8.3 g/dL    Albumin 4.1  3.5 - 5.2 g/dL    AST 213 (*) 0 - 37 U/L    ALT 220 (*) 0  - 53 U/L    Alkaline Phosphatase 146 (*) 39 - 117 U/L    Total Bilirubin 2.6 (*) 0.3 - 1.2 mg/dL    GFR calc non Af Amer >90  >90 mL/min    GFR calc Af Amer >90  >90 mL/min   ETHANOL     Status: Abnormal   Collection Time   03/04/12  7:38 PM      Component Value Range Comment   Alcohol, Ethyl (B) 398 (*) 0 - 11 mg/dL   POTASSIUM     Status: Abnormal   Collection Time   03/04/12 10:40 PM      Component Value Range Comment   Potassium 2.7 (*) 3.5 - 5.1 mEq/L   MAGNESIUM     Status: Normal   Collection Time   03/04/12 10:40 PM      Component Value Range Comment   Magnesium 1.6  1.5 - 2.5 mg/dL   ETHANOL     Status: Normal   Collection Time   03/05/12  6:16 AM      Component Value Range Comment   Alcohol, Ethyl (B) <11  0 - 11 mg/dL   POTASSIUM     Status: Normal   Collection Time   03/05/12  1:31 PM      Component Value Range Comment   Potassium 3.6  3.5 - 5.1 mEq/L DELTA CHECK NOTED    Physical Findings: AIMS: Facial and Oral Movements Muscles of Facial Expression: None, normal Lips and Perioral Area: None, normal Jaw: None, normal Tongue: None, normal,Extremity Movements Upper (arms, wrists, hands, fingers): None, normal Lower (legs, knees, ankles, toes): None, normal, Trunk Movements Neck, shoulders, hips: None, normal, Overall Severity Severity of abnormal movements (highest score from questions above): None, normal Incapacitation due to abnormal movements: None, normal Patient's awareness of abnormal movements (rate only patient's report): No Awareness, Dental Status Current problems with teeth and/or dentures?: No Does patient usually wear dentures?: No  CIWA:  CIWA-Ar Total: 3  COWS:  COWS Total Score: 2   Treatment Plan Summary: Daily contact with patient to assess and evaluate symptoms and progress in treatment Medication management No signs/symptoms of withdrawal from abusable substances.  Plan: Admit, start Librium protocol for alcohol detox.    Trellis Guirguis,  Kasie Leccese 03/06/2012, 1:55 PM

## 2012-03-06 NOTE — Progress Notes (Signed)
Patient ID: Troy Foster, male   DOB: [birth year], 36 y.o.   MRN: [id number]   [facility] Group Notes:  (Counselor/Nursing/MHT/Case Management/Adjunct)  03/06/2012 1:15 PM  Type of Therapy:  Group Therapy, Dance/Movement Therapy   Participation Level:  Active  Participation Quality:  Appropriate  Affect:  Appropriate  Cognitive:  Appropriate  Insight:  Good  Engagement in Group:  Good  Engagement in Therapy:  Good  Modes of Intervention:  Clarification, Problem-solving, Role-play, Socialization and Support  Summary of Progress/Problems: Summary of Progress/Problems: Therapist and group members discussed self-sabotage and enabling. Therapist invited group members to draw a picture of a mountain which was a symbol of their struggle. Group members shared where the currently were on the mountain, obstacles on the mountain, and what was at the top of their mountain. At the beginning of group, therapist asked group members to share what they would like to take from the hospital. Pt. shared that he would like to take hope and trust. Pt. shared that he would like to hold onto the knowledge he has gained from both staff and patients.      Cassidi Long

## 2012-03-06 NOTE — Progress Notes (Signed)
D  Troy Foster is seen out in the milieu. He remains sad, depressed and has a blunted, flat affect. HE attended his group this morning, he was attentive to the group discussion and he is engaged in trying to learn healthier ways to cope and get his needs met. HE is tremulous and anxious at 1200 when he took his regularly - scheduled ativan. He completed his self inventory and on it he wrote he denies SI in the past 24 hrs , he rated his depression and hopelessness " - / - " and stated his DC plan is : " all the alcohol is out of the house". A POC in place.  R Safety is maintained  And POC includes coninuing to process with pt and help him practice healthier coping mechanisms PD RN [employer]

## 2012-03-06 NOTE — H&P (Signed)
Psychiatric Admission Assessment Adult  Patient Identification:  Troy Foster  Date of Evaluation:  03/06/2012  Chief Complaint:  alcohol dependence  History of Present Illness: This is a 36 year old Caucasian male, admitted to [facility] from the [facility] ED with complaints of alcohol abuse. Patient reports, "I am here for alcohol detox. I have been drinking alcohol x 20 years. I drink 1 & 1/2 gallon of Vodka a week. Drinking alcohol numbs me from my pain. I have a lot of emotional issues. My ex-wife died tragically of a car wreck. My second wife divorced me. These events caused me a lot of grief. I have encountered a lot of personal problems as a result of my drinking. My drinking has caused me my good friends, jobs and I am alienated from my family due to my excessive drinking. I have received 3 DUIs and lost my privilege to drive. I have not been to any treatment program in the past. This is my first time to try to reach out for help. I am not depressed. I don't smoke or use any other substance"  Mood Symptoms:  Poor sleep  Depression Symptoms:  insomnia,  (Hypo) Manic Symptoms:  Irritable Mood,  Anxiety Symptoms:  Excessive Worry,  Psychotic Symptoms:  Hallucinations: None  PTSD Symptoms: Had a traumatic exposure:  None  Past Psychiatric History: Diagnosis: Alcohol abuse continuous.  Hospitalizations:[facility]  Outpatient Care: None reported  Substance Abuse Care: None reported  Self-Mutilation: Denies   Suicidal Attempts: Denies   Violent Behaviors: Denies   Past Medical History:   Past Medical History  Diagnosis Date  . Fatty liver   . Anemia   . Elevated liver enzymes   . GERD (gastroesophageal reflux disease)   . Alcohol abuse      Allergies:  No Known Allergies  PTA Medications: Prescriptions prior to admission  Medication Sig Dispense Refill  . predniSONE (DELTASONE) 10 MG tablet Take 10 mg by mouth 2 (two) times daily.         Substance Abuse History in  the last 12 months: Substance Age of 1st Use Last Use Amount Specific Type  Nicotine Denies use     Alcohol 15 Prior to hosp 1 &1/2 gallon of Vodka a week Vodka.  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  Legal History: "I have had 3 DUI, loss my privilege to drive"  Hobbies/Interests: None reported  Family History:   Family History  Problem Relation Age of Onset  .  Colon cancer Neg Hx   . Diabetes Father   . Diabetes Paternal Aunt   . Testicular cancer Father     Mental Status Examination/Evaluation: Objective:  Appearance: Casual  Eye Contact::  Good  Speech:  Clear and Coherent  Volume:  Normal  Mood:  "I feel okay"  Affect:  Appropriate  Thought Process:  Coherent and Intact  Orientation:  Full  Thought Content:  Rumination  Suicidal Thoughts:  No  Homicidal Thoughts:  No  Memory:  Immediate;   Good Recent;   Good Remote;   Good  Judgement:  Fair  Insight:  Fair  Psychomotor Activity:  Normal  Concentration:  Good  Recall:  Good  Akathisia:  No  Handed:  Right  AIMS (if indicated):     Assets:  Communication  Skills Desire for Improvement  Sleep:  Number of Hours: 5.75     Laboratory/X-Ray Psychological Evaluation(s)      Assessment:    AXIS I:  Alcohol Abuse AXIS II:  Deferred AXIS III:   Past Medical History  Diagnosis Date  . Fatty liver   . Anemia   . Elevated liver enzymes   . GERD (gastroesophageal reflux disease)   . Alcohol abuse    AXIS IV:  other psychosocial or environmental problems and alcohol abuse AXIS V:  41-50 serious symptoms  Treatment Plan/Recommendations: Admit for safety and stabilization. Review and reinstate any pertinent home medications for other medical issues. Initiate Librium protocol. Group counseling/AA/NA meetings.  Treatment Plan Summary: Daily contact with patient to assess and evaluate symptoms and progress in treatment Medication management  Current Medications:  Current Facility-Administered Medications  Medication Dose Route Frequency Provider Last Rate Last Dose  . alum & mag hydroxide-simeth (MAALOX/MYLANTA) 200-200-20 MG/5ML suspension 30 mL  30 mL Oral Q4H PRN Verne Spurr, PA-C      . hydrOXYzine (ATARAX/VISTARIL) tablet 25 mg  25 mg Oral Q6H PRN Curlene Labrum Readling, MD      . loperamide (IMODIUM) capsule 2-4 mg  2-4 mg Oral PRN Curlene Labrum Readling, MD      . LORazepam (ATIVAN) tablet 1 mg  1 mg Oral Q6H Curlene Labrum Readling, MD   1 mg at 03/06/12 1208  . LORazepam (ATIVAN) tablet 1 mg  1 mg Oral Q8H Randy D Readling, MD      . LORazepam (ATIVAN) tablet 1 mg  1 mg Oral Q12H Randy D Readling, MD      . LORazepam (ATIVAN) tablet 1 mg  1 mg Oral Once Curlene Labrum Readling, MD      . LORazepam (ATIVAN) tablet 1 mg  1 mg Oral Q6H PRN Curlene Labrum Readling, MD      . magnesium hydroxide (MILK OF MAGNESIA) suspension 30 mL  30 mL Oral Daily PRN Verne Spurr, PA-C      . multivitamin with minerals tablet 1 tablet  1 tablet Oral Daily Ronny Bacon, MD   1 tablet at 03/06/12 0826  . ondansetron (ZOFRAN-ODT) disintegrating tablet 4 mg  4 mg Oral Q6H PRN  Curlene Labrum Readling, MD      . predniSONE (DELTASONE) tablet 10 mg  10 mg Oral BID WC Curlene Labrum Readling, MD   10 mg at 03/06/12 0826  . predniSONE (DELTASONE) tablet 10 mg  10 mg Oral Once Ronny Bacon, MD   10 mg at 03/05/12 2300  . thiamine (B-1) injection 100 mg  100 mg Intramuscular Once Ronny Bacon, MD      .  thiamine (VITAMIN B-1) tablet 100 mg  100 mg Oral Daily Curlene Labrum Readling, MD   100 mg at 03/06/12 0826  . DISCONTD: acetaminophen (TYLENOL) tablet 650 mg  650 mg Oral Q6H PRN Verne Spurr, PA-C      . DISCONTD: LORazepam (ATIVAN) tablet 1 mg  1 mg Oral Q6H PRN Ronny Bacon, MD       Facility-Administered Medications Ordered in Other Encounters  Medication Dose Route Frequency Provider Last Rate Last Dose  . DISCONTD: acetaminophen (TYLENOL) tablet 650 mg  650 mg Oral Q4H PRN Pascal Lux Wingen, PA-C      . DISCONTD: alum & mag hydroxide-simeth (MAALOX/MYLANTA) 200-200-20 MG/5ML suspension 30 mL  30 mL Oral PRN Pascal Lux Wingen, PA-C   30 mL at 03/05/12 1646  . DISCONTD: folic acid (FOLVITE) tablet 1 mg  1 mg Oral Daily Pascal Lux Wingen, PA-C   1 mg at 03/05/12 1055  . DISCONTD: ibuprofen (ADVIL,MOTRIN) tablet 600 mg  600 mg Oral Q8H PRN Pascal Lux Wingen, PA-C      . DISCONTD: LORazepam (ATIVAN) injection 1 mg  1 mg Intravenous Q6H PRN Pascal Lux Wingen, PA-C      . DISCONTD: LORazepam (ATIVAN) tablet 0-4 mg  0-4 mg Oral Q6H Heather Van Wingen, PA-C   1 mg at 03/05/12 1605  . DISCONTD: LORazepam (ATIVAN) tablet 0-4 mg  0-4 mg Oral Q12H Heather Van Wingen, PA-C   1 mg at 03/05/12 1110  . DISCONTD: LORazepam (ATIVAN) tablet 1 mg  1 mg Oral Q6H PRN Pascal Lux Wingen, PA-C   1 mg at 03/05/12 1557  . DISCONTD: LORazepam (ATIVAN) tablet 1 mg  1 mg Oral Q8H PRN Pascal Lux Wingen, PA-C      . DISCONTD: LORazepam (ATIVAN) tablet 1 mg  1 mg Oral QID Verne Spurr, PA-C      . DISCONTD: LORazepam (ATIVAN) tablet 1 mg  1 mg Oral TID PRN Verne Spurr, PA-C      . DISCONTD:  LORazepam (ATIVAN) tablet 1 mg  1 mg Oral BID Verne Spurr, PA-C      . DISCONTD: multivitamin with minerals tablet 1 tablet  1 tablet Oral Daily Pascal Lux Wingen, PA-C   1 tablet at 03/05/12 1055  . DISCONTD: ondansetron (ZOFRAN) tablet 4 mg  4 mg Oral Q8H PRN Pascal Lux Wingen, PA-C      . DISCONTD: potassium chloride SA (K-DUR,KLOR-CON) CR tablet 20 mEq  20 mEq Oral BID Pascal Lux Wingen, PA-C   20 mEq at 03/05/12 1055  . DISCONTD: thiamine (B-1) injection 100 mg  100 mg Intravenous Daily Pascal Lux Wingen, PA-C   100 mg at 03/04/12 2125  . DISCONTD: thiamine (VITAMIN B-1) tablet 100 mg  100 mg Oral Daily Pascal Lux Wingen, PA-C   100 mg at 03/05/12 1055  . DISCONTD: zolpidem (AMBIEN) tablet 5 mg  5 mg Oral QHS PRN Magnus Sinning, PA-C        Observation Level/Precautions:  Q 15 minute checks on file  Laboratory:  Reviewed ED lab findings on file.  Psychotherapy: Group, NA/AA meetings   Medications:  See medication lists  Routine PRN Medications:  Yes  Consultations:  None indicated at this time  Discharge Concerns:  Safety and sobriety  Other:     Armandina Stammer I 7/6/20131:22 PM

## 2012-03-06 NOTE — Progress Notes (Signed)
Psychoeducational Group Note  Date:  03/06/2012 Time:  1515  Group Topic/Focus:  Healthy Communication:   The focus of this group is to discuss communication, barriers to communication, as well as healthy ways to communicate with others.  Participation Level:  Active  Participation Quality:  Appropriate, Attentive, Sharing and Supportive  Affect:  Appropriate  Cognitive:  Appropriate  Insight:  Good  Engagement in Group:  Good  Additional Comments:  Pt identified unhealthy and healthy coping skills. Pt gave an example of negative and positive ways to communicate (gestures, words, tone of voice, and body language. Pt role play with unhealthy way and negative way to communicate. Pt was supportive, and shared information during group. Pt stated one way to communicate in a healthy way upon discharge is stop and think before speaking and look at the whole situation. Pt also stated not to blame anyone else.   Karleen Hampshire Brittini 03/06/2012, 6:55 PM

## 2012-03-07 NOTE — Progress Notes (Signed)
Psychoeducational Group Note  Date:  03/07/2012 Time:  1535  Group Topic/Focus:  Conflict Resolution:   The focus of this group is to discuss the conflict resolution process and how it may be used upon discharge.  Participation Level:  Active  Participation Quality:  Appropriate, Sharing and Supportive  Affect:  Appropriate  Cognitive:  Appropriate  Insight:  Good  Engagement in Group:  Good  Additional Comments:  Pt discussed actions, thoughts, and feelings that can be changed within a situation that can change the outcome.   Dalia Heading 03/07/2012, 6:51 PM

## 2012-03-07 NOTE — Progress Notes (Signed)
Patient ID: Troy Foster, male   DOB: 11-12-1975, 36 y.o.   MRN: [id number] Pt. attended and participated in aftercare planning group. Pt. accepted information on suicide prevention, warning signs to look for with suicide and crisis line numbers to use. The pt. agreed to call crisis line numbers if having warning signs or having thoughts of suicide. Pt. listed their current anxiety level as 2 and depression level as a 1 on a scale of 1 to 10 with 10 being the high.  Pt. Indicated that he has an interest in [employer] located in the [place] area.

## 2012-03-07 NOTE — Progress Notes (Signed)
Patient ID: Marcia Lepera, male   DOB: 06/05/1976, 36 y.o.   MRN: [id number] Santosh came out to the dayroom for group this evening, got a snack and went back to his room.  His interaction with staff and peers is very limited, remains quiet and isolative, shares little , doesn't initiate conversations.  Affect remains flat, sad, and doesn't put a number on his depression and hopelessness on his self inventory.  Came to med window with much encouragement, denies w/d sx, shared little about the book he was reading and nothing about himself, answers with a shrug. Will continue to monitor for safety.

## 2012-03-07 NOTE — Progress Notes (Signed)
Patient ID: Troy Foster, male   DOB: Feb 14, 1976, 36 y.o.   MRN: [id number] Has been quiet, somewhat isolative, spending time in his room reading, affect is flat and sad.  States is feeling OK and not admitting to any w/d sx, but on self inventory didn't rate his depression and hopelessness.  Very little interaction noted with staff or peers, always polite, but hard to engage. Will continue to try to develop therapeutic rapport. Will continue to monitor.

## 2012-03-07 NOTE — Progress Notes (Signed)
D  Rasmus is up this morning. He remains sad, blunted with a flat affect. He takes his meds as ordered. He completed his self inventory and on it he wrote he denied SI in the past 24 hrs. He rated his depression and hopelessness " - / - " and stated his DC plan was: " take meds, classes, eat". A  He remains on Ativan tid per MD order. HE is anxious, tremulous, but denies any other withdrawal symptoms. R  Safety is in place. Therapeutic alliance is fostered and POC cont PD RN [facility]

## 2012-03-07 NOTE — Progress Notes (Signed)
  Troy Foster is a 36 y.o. male [id number] 31-Dec-1975  03/05/2012 Principal Problem:  *Alcohol abuse, continuous Active Problems:  Substance induced mood disorder   Mental Status: Mood is better than yesterday denies SI/HI/AVH    Subjective/Objective: Tremor is slight -better than yesterday would like a long term SA program.    Filed Vitals:   03/07/12 1101  BP: 118/86  Pulse: 88  Temp:   Resp:     Lab Results:   BMET    Component Value Date/Time   NA 140 03/04/2012 1938   K 3.6 03/05/2012 1331   CL 94* 03/04/2012 1938   CO2 29 03/04/2012 1938   GLUCOSE 123* 03/04/2012 1938   BUN 5* 03/04/2012 1938   CREATININE 0.69 03/04/2012 1938   CALCIUM 9.4 03/04/2012 1938   GFRNONAA >90 03/04/2012 1938   GFRAA >90 03/04/2012 1938    Medications:  Scheduled:     . LORazepam  1 mg Oral Q6H  . LORazepam  1 mg Oral Q8H  . LORazepam  1 mg Oral Q12H  . LORazepam  1 mg Oral Once  . LORazepam  1 mg Oral Once  . multivitamin with minerals  1 tablet Oral Daily  . pantoprazole  20 mg Oral BID AC  . predniSONE  10 mg Oral BID WC  . thiamine  100 mg Intramuscular Once  . thiamine  100 mg Oral Daily     PRN Meds alum & mag hydroxide-simeth, hydrOXYzine, loperamide, magnesium hydroxide, ondansetron  Plan: he will ask counselor for programs in his desired geographic area.           Continue with current plan of care  [name],[name] 03/07/2012

## 2012-03-07 NOTE — Progress Notes (Signed)
Patient ID: Troy Foster, male   DOB: Nov 23, 1975, 36 y.o.   MRN: [id number] Pt. attended and participated in aftercare planning group. Pt. accepted information on suicide prevention, warning signs to look for with suicide and crisis line numbers to use. The pt. agreed to call crisis line numbers if having warning signs or having thoughts of suicide. Pt. listed their current anxiety level as 2 and depression level as a 1 on a scale of 1 to 10 with 10 being the high.  Pt. Indicated that he is interested in looking into [employer] in [place].
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
## 2012-03-07 NOTE — Progress Notes (Signed)
Patient ID: Troy Foster, male   DOB: 1975/09/02, 36 y.o.   MRN: [id number]  [facility] Group Notes:  (Counselor/Nursing/MHT/Case Management/Adjunct)  03/07/2012 1:15 PM  Type of Therapy:  Group Therapy, Dance/Movement Therapy   Participation Level:  Minimal  Participation Quality:  Appropriate  Affect:  Appropriate  Cognitive:  Appropriate  Insight:  Limited  Engagement in Group:  None  Engagement in Therapy:  Limited  Modes of Intervention:  Clarification, Problem-solving, Role-play, Socialization and Support  Summary of Progress/Problems: Therapist discussed the topic of support.  Therapist asked group what is your  definition of support and who are the supporters in their lives? Therapist asked group what is wanted from our supporters? Group discussed knowing the difference between positive and negative supports.  Pt. stated, "family is my support.  "  Additionally, therapist discussed understanding the needs and of the supporter and how to prepare yourself when you feel you have no support.         Rhunette Croft

## 2012-03-07 NOTE — Progress Notes (Signed)
Brief Nutrition Note  Patient identified on the Nutrition Risk Report for weight loss.  Pt reports UBW of145-160#.   Wt now decreased to 122# per pt.  Weight loss due to heavy drinking and not eating.    There is no height or weight on file to calculate BMI. Pt appears underweight.  Current diet order is regular with snacks.  Good intake per pt..  Labs and medications reviewed.   Continue MVI and thiamine. No further nutrition interventions warranted at this time. If additional nutrition issues arise, please re-consult RD.   Oran Rein, RD [phone]

## 2012-03-08 NOTE — Progress Notes (Signed)
Troy Foster is seen first thing this morning. in the milieu. He makes good eye contact. He is pleasant, cooperative and takes his medications as ordered by MD. He completes his self inventory and on it he wrote he denied SI within the past 24 hrs and states his DC plan is to " stick to our plan and get better". A He attends his groups as planned and is quietly engaged in his POC.  R Safety is maintained. Detox cont with no withdrawal complications identified. Cont to faster therapeutic alliance . PD RN [facility]

## 2012-03-08 NOTE — Progress Notes (Signed)
[facility] Adult Inpatient Family/Significant Other Suicide Prevention Education  Suicide Prevention Education:  Education Completed; Girlfriend, Mancel Bale at [phone] has been identified by the patient as the family member/significant other with whom the patient will be residing, and identified as the person(s) who will aid the patient in the event of a mental health crisis (suicidal ideations/suicide attempt).  With written consent from the patient, the family member/significant other has been provided the following suicide prevention education, prior to the and/or following the discharge of the patient.  The suicide prevention education provided includes the following:  Suicide risk factors  Suicide prevention and interventions  National Suicide Hotline telephone number  [facility] assessment telephone number  [facility] Emergency Assistance 911  [facility] and/or Residential Mobile Crisis Unit telephone number  Request made of family/significant other to:  Remove weapons (e.g., guns, rifles, knives), all items previously/currently identified as safety concern.    Remove drugs/medications (over-the-counter, prescriptions, illicit drugs), all items previously/currently identified as a safety concern.  Pt's girlfriend Verlon Au states there are no firearms, narcotics nor alcohol in the home.  The family member/significant other verbalizes understanding of the suicide prevention education information provided.  The family member/significant other agrees to remove the items of safety concern listed above.  Clide Dales 03/08/2012, 5:31 PM

## 2012-03-08 NOTE — Progress Notes (Signed)
[facility] MD Progress Note  03/08/2012 5:22 PM  S/O: Patient seen and evaluated. Chart reviewed. Patient stated that his mood was "good". His affect was mood congruent and euthymic. He denied any current thoughts of self injurious behavior, suicidal ideation or homicidal ideation. He denied any significant depressive signs or symptoms at this time. There were no auditory or visual hallucinations, paranoia, delusional thought processes, or mania noted.  Thought process was linear and goal directed.  No psychomotor agitation or retardation was noted. His speech was normal rate, tone and volume. Eye contact was good. Judgment and insight are fair.  Patient has been up and engaged on the unit.  No acute safety concerns reported from team.  Sleep:  Number of Hours: 4.75    Vital Signs:Blood pressure 119/82, pulse 94, temperature 97.8 F (36.6 C), temperature source Oral, resp. rate 18.  Lab Results: No results found for this or any previous visit (from the past 48 hour(s)).  Physical Findings: AIMS: Facial and Oral Movements Muscles of Facial Expression: None, normal Lips and Perioral Area: None, normal Jaw: None, normal Tongue: None, normal,Extremity Movements Upper (arms, wrists, hands, fingers): None, normal Lower (legs, knees, ankles, toes): None, normal, Trunk Movements Neck, shoulders, hips: None, normal, Overall Severity Severity of abnormal movements (highest score from questions above): None, normal Incapacitation due to abnormal movements: None, normal Patient's awareness of abnormal movements (rate only patient's report): No Awareness, Dental Status Current problems with teeth and/or dentures?: Yes Does patient usually wear dentures?: Yes  CIWA:  CIWA-Ar Total: 3  COWS:  COWS Total Score: 2   A/P: Alcohol Dependence; SIMD, resolved  Pt seen and evaluated in treatment team.  Reviewed short term and long term goals, medications, current treatment in the hospital and acute/chronic safety.   Pt denied any current thoughts of self harm, suicidal ideation or homicidal ideation.  Contracted for safety on the unit.  No acute issues noted.  VS reviewed with team.  Pt agreeable with treatment plan, see orders.  Dispo pending, exploring [employer] options.   Lupe Carney 03/08/2012, 5:22 PM

## 2012-03-08 NOTE — Progress Notes (Signed)
[facility] Group Notes:  (Counselor/Nursing/MHT/Case Management/Adjunct)   03/09/2012 9:02 AM   Type of Therapy:  Group Therapy 1:15 to 2:30 on 03/08/2012   Participation Level:  Active  Participation Quality:  Attentive and Sharing  Affect:  Appropriate  Cognitive:  Appropriate  Insight:  Good  Engagement in Group:  Good  Engagement in Therapy:  Good  Modes of Intervention:  Clarification, Orientation, Socialization and Support  Summary of Progress/Problems: Group discussion focused on what patient's see as their own obstacles to recovery.  Patient shares belief that recovery will be difficult to deal with "because I don't really know what to expect, I haven't been not under the influence in so very long, like 16 years, I don't know who I really am or can be."  Patient's feelings were normalized by Clinical research associate and others in group who offered support.  "I don't think my body can go on like this."  Later patient shared "I don't think I want to go on like this."  Patient seemed to accept the suggestion that "You don't have to go on drinking."   [place] Giammona, Julious Payer

## 2012-03-08 NOTE — Progress Notes (Signed)
Pt attended group and was active and appropriate. Pt played the card game: apple to apples with staff and other pts. 

## 2012-03-08 NOTE — Progress Notes (Signed)
[facility] Group Notes:  (Counselor/Nursing/MHT/Case Management/Adjunct)  03/08/2012 1:25 PM  Type of Therapy:  Psychoeducational Skills  Participation Level:  Active  Participation Quality:  Appropriate  Affect:  Appropriate  Cognitive:  Appropriate  Insight:  Good  Engagement in Group:  Good  Engagement in Therapy:  Good  Modes of Intervention:  Support  Summary of Progress/Problems:   Christ Kick 03/08/2012, 1:25 PM

## 2012-03-09 MED ORDER — LORAZEPAM 1 MG PO TABS: 1.0000 mg | ORAL_TABLET | Freq: Once | ORAL | Status: DC

## 2012-03-09 MED ORDER — PANTOPRAZOLE SODIUM 20 MG PO TBEC: 20.0000 mg | DELAYED_RELEASE_TABLET | Freq: Two times a day (BID) | ORAL | Status: DC

## 2012-03-09 MED ORDER — LORAZEPAM 1 MG PO TABS
1.0000 mg | ORAL_TABLET | Freq: Two times a day (BID) | ORAL | Status: DC
  Administered 2012-03-09: 1 mg via ORAL

## 2012-03-09 NOTE — Treatment Plan (Signed)
Interdisciplinary Treatment Plan Update (Adult)  Date: 03/09/2012  Time Reviewed: 10:48 AM   Progress in Treatment: Attending groups: Yes Participating in groups: Yes Taking medication as prescribed: Yes Tolerating medication: Yes   Family/Significant other contact made:  Yes Patient understands diagnosis:  Yes  As evidenced by asking for help with alcohol detox Discussing patient identified problems/goals with staff:  Yes  See below Medical problems stabilized or resolved:  Yes Denies suicidal/homicidal ideation: Yes  In tx team Issues/concerns per patient self-inventory:  None Other:  New problem(s) identified: N/A  Reason for Continuation of Hospitalization: Other; describe D/C today  Interventions implemented related to continuation of hospitalization:   Additional comments:  Estimated length of stay:  Discharge Plan:  See below  New goal(s): N/A  Review of initial/current patient goals per problem list:   1.  Goal(s):Safely detox from alcohol  Met:  Yes  Target date:7/9  As evidenced ZO:XWRUEA vitals, no withdrawal symptoms  2.  Goal (s):Identify comprehensive sobriety plan  Met:  Yes  Target date:7/9  As evidenced VW:[id number] to stay in an [facility] [place] and attend daily AA mtgs  3.  Goal(s):  Met:  Yes  Target date:  As evidenced by:  4.  Goal(s):  Met:  Yes  Target date:  As evidenced by:  Attendees: Patient:  Troy Foster 03/09/2012 10:48 AM  Family:     Physician:  Lupe Carney 03/09/2012 10:48 AM   Nursing: Robbie Louis   03/09/2012 10:48 AM   Case Manager:  Richelle Ito, LCSW 03/09/2012 10:48 AM   Counselor:  Ronda Fairly, LCSWA 03/09/2012 10:48 AM   Other:     Other:     Other:     Other:      Scribe for Treatment Team:   Ida Rogue, 03/09/2012 10:48 AM

## 2012-03-09 NOTE — Progress Notes (Signed)
[facility] Group Notes:  (Counselor/Nursing/MHT/Case Management/Adjunct)  03/09/2012 1:45 PM  Type of Therapy:  Psychoeducational Skills  Participation Level:  Minimal  Participation Quality:  Appropriate and Attentive  Affect:  Appropriate  Cognitive:  Alert, Appropriate and Oriented  Insight:  Good  Engagement in Group:  Good  Engagement in Therapy:  n/a  Modes of Intervention:  Activity, Education, Problem-solving, Socialization and Support  Summary of Progress/Problems: Troy Foster attended Psychoeducational group that focused on using quality time with support systems/individuals to engage in [employer]. Troy Foster participated in activity guessing about self and peers. Troy Foster was quiet but attentive while group discussed who their support systems are, how they can spend positive quality time with them as a coping skill and a way to strengthen their relationship. Troy Foster was given a homework assignment to find two ways to improve his support systems and twenty activities he can do to spend quality time with his supports.     Troy Foster 03/09/2012, 1:45 PM

## 2012-03-09 NOTE — Progress Notes (Signed)
Patient ID: Troy Foster, male   DOB: 28-Jan-1976, 36 y.o.   MRN: [id number] Pt discharged, picked up by girlfriend. He voiced understanding of discharge instruction, he refused follow -up treatment. All belonging taken home with him. He denies thought of SI. Requested and received letter  To return to work.

## 2012-03-09 NOTE — [facility] Suicide Risk Assessment (Signed)
Suicide Risk Assessment  Discharge Assessment      Demographic factors: Male;Caucasian  Current Mental Status Per Nursing Assessment:  On Admission:   (Denies) At Discharge: Pt denied any SI/HI/thoughts of self harm or acute psychiatric issues in treatment team with clinical, nursing and medical team present.  Current Mental Status Per Physician: Patient seen and evaluated. Chart reviewed. Patient stated that his mood was "good". His affect was mood congruent and euthymic. He denied any current thoughts of self injurious behavior, suicidal ideation or homicidal ideation. He denied any significant depressive signs or symptoms at this time. There were no auditory or visual hallucinations, paranoia, delusional thought processes, or mania noted. Thought process was linear and goal directed. No psychomotor agitation or retardation was noted. His speech was normal rate, tone and volume. Eye contact was good. Judgment and insight are fair. Patient has been up and engaged on the unit. No acute safety concerns reported from team. Pt excited about [facility] placement.  Loss Factors: Decline in physical health in recent past; stabilized  Historical Factors: No reported hx trauma or co-morbid psychiatric issues.  Risk Reduction Factors:  Sense of responsibility to family;Living with another person, especially a relative;Positive social support;Positive therapeutic relationship; [facility]  Discharge Diagnoses: Alcohol Dependence; SIMD, resolved   Cognitive Features That Contribute To Risk: none.  Suicide Risk: Pt viewed as a chronic moderate increased risk of harm to self in light of his past hx and risk factors.  No acute safety concerns noted since on the unit.  Pt contracting for safety and is stable for discharge.  Plan Of Care/Follow-up recommendations: Pt seen and evaluated in treatment team. Chart reviewed.  Pt stable for and requesting discharge to [facility]. Pt contracting for safety  and does not currently meet [facility] involuntary commitment criteria for continued hospitalization against his will.  Mental health treatment, medication management and continued sobriety will mitigate against the potential increased risk of harm to self and/or others.  Discussed the importance of recovery further with pt, as well as, tools to move forward in a healthy & safe manner.  Pt agreeable with the plan.  Discussed with the team.  Please see orders, follow up appointments per AVS and full discharge summary to be completed by physician extender.  Recommend follow up with AA.  Diet: Regular.  Activity: As tolerated.  Pt declined continuation of prednisone and said he would purchase Protonix OTC.  No meds necessary upon discharge.  Discussed with team.  Troy Foster 03/09/2012, 12:00 PM

## 2012-03-09 NOTE — Progress Notes (Signed)
Patient ID: Troy Foster, male   DOB: [birth year], 36 y.o.   MRN: [id number] He has been up and to groups, interacting with peers and staff. Denies withdrawal symptoms. Stated that he wanted to leave.

## 2012-03-09 NOTE — Progress Notes (Signed)
[facility] Case Management Discharge Plan:  Will you be returning to the same living situation after discharge: No. At discharge, do you have transportation home?:Yes,  girlfriend Do you have the ability to pay for your medications:Yes,  no meds  Interagency Information:     Release of information consent forms completed and in the chart;  Patient's signature needed at discharge.  Patient to Follow up at:  Follow-up Information    Follow up with No scheduled follow up with providers.  Attend 90 AA mtgs in 90 days.         Patient denies SI/HI:   Yes,  yes    Safety Planning and Suicide Prevention discussed:  Yes,  yes  Barrier to discharge identified:No.  Summary and Recommendations:   Troy Foster 03/09/2012, 11:42 AM

## 2012-03-09 NOTE — Discharge Summary (Signed)
Physician Discharge Summary Note  Patient:  Troy Foster is an 36 y.o., male MRN:  [id number] DOB:  March 14, 1976 Patient phone:  [phone] (home)  Patient address:   [address] [place] [address],   Date of Admission:  03/05/2012 Date of Discharge:03/09/2012  Reason for Admission: Alcohol detox  Discharge Diagnoses: Principal Problem:  *Alcohol abuse, continuous Active Problems:  Substance induced mood disorder   Axis Diagnosis:  Discharge Diagnoses: Alcohol Dependence; SIMD, resolved  Level of Care:  OP  Hospital Course:  Troy Foster was admitted for detox to the [facility].  He was treated with the standard Librium detox with supportive medication for symptoms of withdrawal.  Medical problems were treated accordingly.  His response to detox was monitored by daily CIWA scores.  He was evaluated daily by a clinical provider. Troy Foster was also asked to complete a daily self assessment regarding his mental and emotional status.  He was offered group therapy and individual visits with a therapist.     Troy Foster was also evaluated by a treatment team to discuss his plans for continued sobriety upon his discharge. He had interest in going to a half way house and that was in the plans per CM notes.  On the day of discharge he reported no further symptoms of withdrawal, and requested to be discharged. Consults:  None  Significant Diagnostic Studies:  None  Discharge Vitals:   Blood pressure 112/78, pulse 93, temperature 97.5 F (36.4 C), temperature source Oral, resp. rate 16.  Mental Status Exam: See Mental Status Examination and Suicide Risk Assessment completed by Attending Physician prior to discharge.  Discharge destination:  Other:  half way house  Is patient on multiple antipsychotic therapies at discharge:  No   Has Patient had three or more failed trials of antipsychotic monotherapy by history:  No  Recommended Plan for Multiple Antipsychotic  Therapies:not applicable  Discharge Orders    Future Orders Please Complete By Expires   Diet - low sodium heart healthy      Increase activity slowly      Discharge instructions      Comments:   Take all of your medications as prescribed.  Be sure to keep ALL follow up appointments as scheduled. This is to ensure getting your refills on time to avoid any interruption in your medication.  If you find that you can not keep your appointment, call the clinic and reschedule. Be sure to tell the nurse if you will need a refill before your appointment.     Medication List  As of 03/09/2012  1:34 PM   STOP taking these medications         predniSONE 10 MG tablet         TAKE these medications      Indication    pantoprazole 20 MG tablet   Commonly known as: PROTONIX   Take 1 tablet (20 mg total) by mouth 2 (two) times daily before a meal. For GERD.    Indication: Erosive Esophagitis with Gastroesophageal Reflux           Follow-up Information    Follow up with No scheduled follow up with providers.  Attend 90 AA mtgs in 90 days.         Follow-up recommendations:  Resume all activity as tolerated. Heart healthy diet  Comments:  90 meetings in 90 days.  Signed: Rona Ravens. Saron Tweed PAC for  Dr. Lupe Carney 03/09/2012, 1:34 PM

## 2012-03-09 NOTE — Progress Notes (Signed)
[facility] Group Notes:  (Counselor/Nursing/MHT/Case Management/Adjunct)  03/09/2012   Type of Therapy:  Group Therapy 1:15 to 2:30 on Tuesday 03/09/12  Participation Level:  Did Not Attend as patient reported he was about to discharge at 1PM. Writer encouraged patient to attend group and nurse would locate when time to discharge yet patient declined.  Patient still on hall at end of group.    Troy Foster

## 2012-03-10 NOTE — Progress Notes (Addendum)
Patient Discharge Instructions: Per case manager patient refused follow up.   Wandra Scot, 03/10/2012, 3:18 PM

## 2012-03-14 IMAGING — US US BIOPSY
1 series · 12 of 12 positions shown · non-contrast
Comparison: Abdominal CT - 09/14/2011;

INDICATION: Elevated liver enzymes, hepatic steatosis on recent
abdominal ultrasound and abdominal CT.

ULTRASOUND GUIDED LIVER BIOPSY

[Series 1: us biopsy · 0.26mm/px · 12 of 12 slices shown]
[im 1/12]
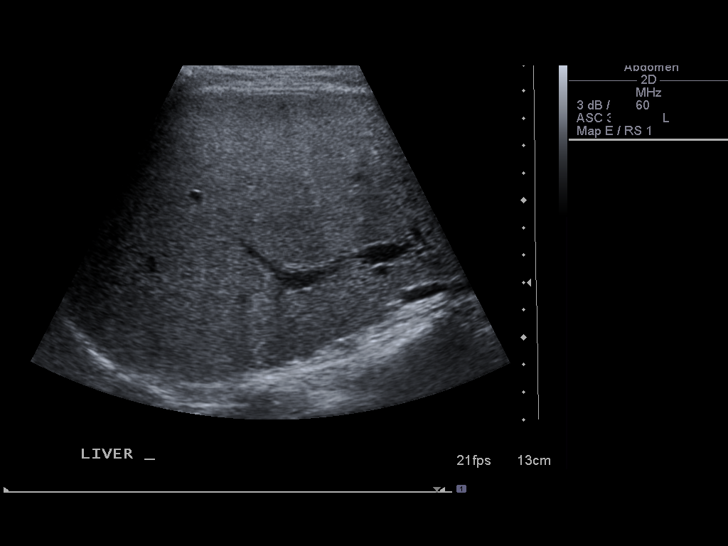
[im 2/12]
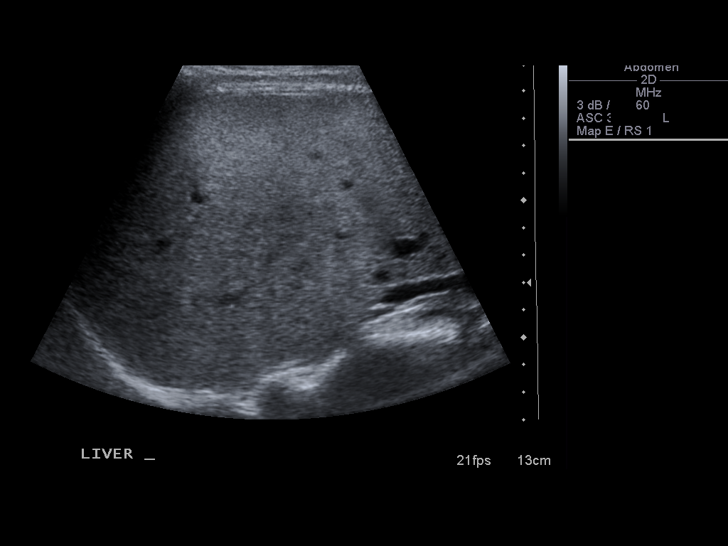
[im 3/12]
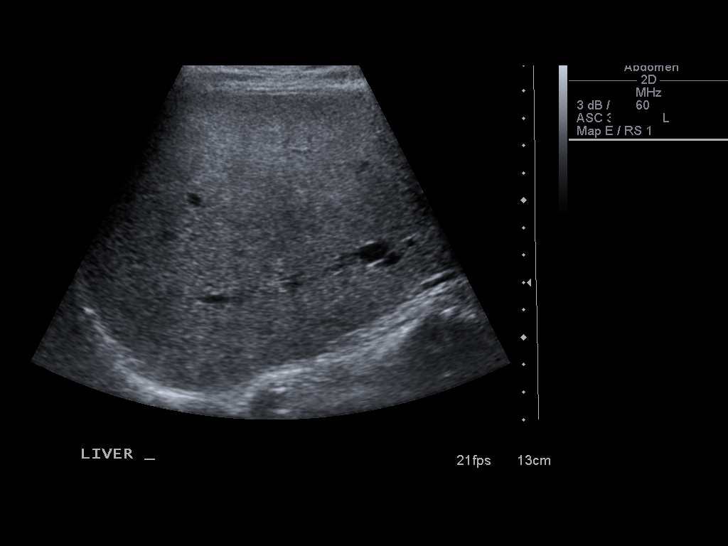
[im 4/12]
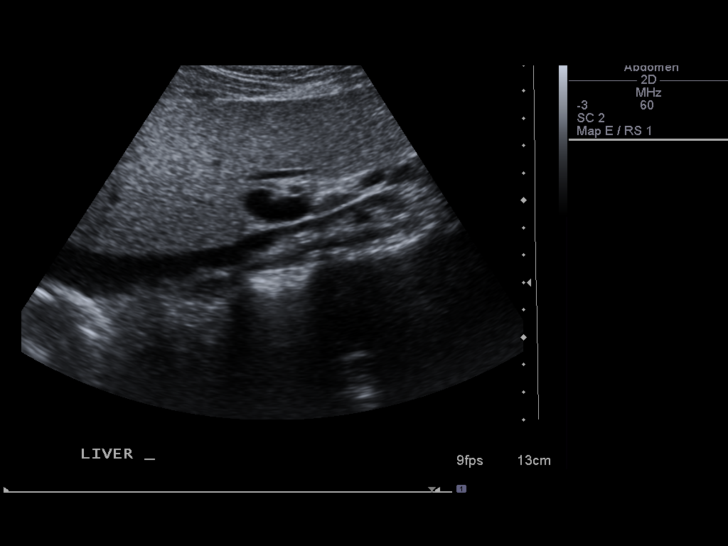
[im 5/12]
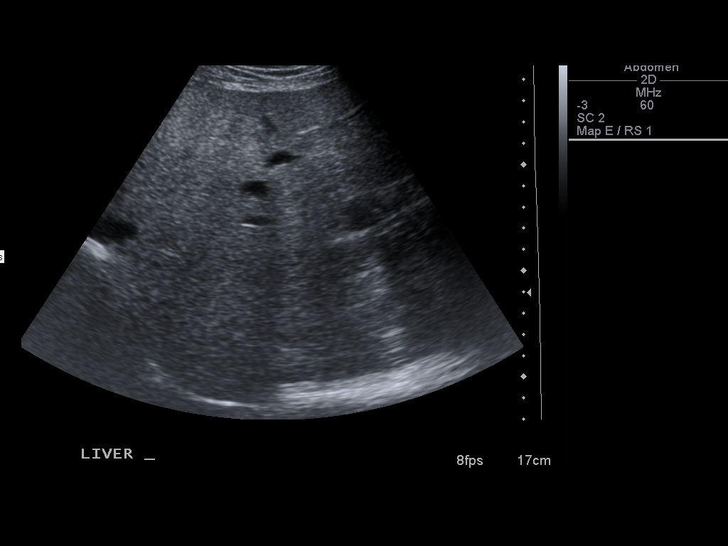
[im 6/12]
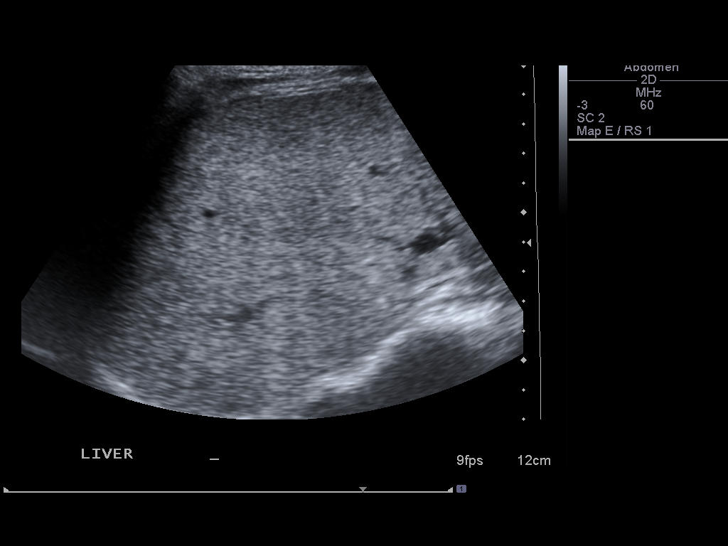
[im 7/12]
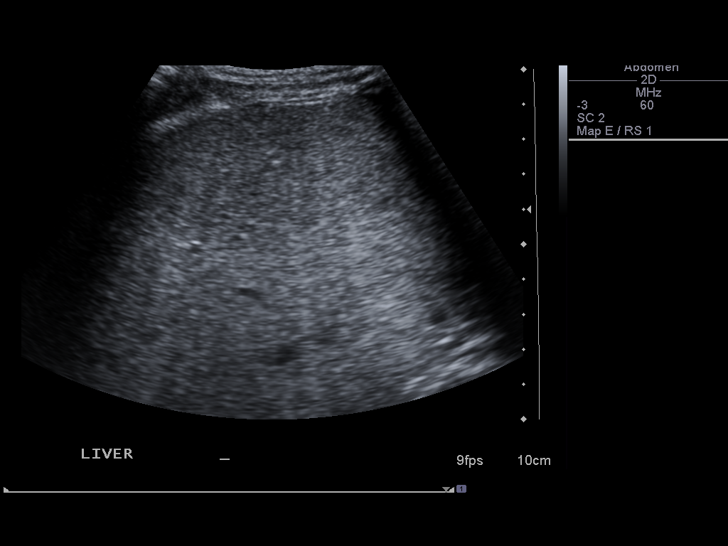
[im 8/12]
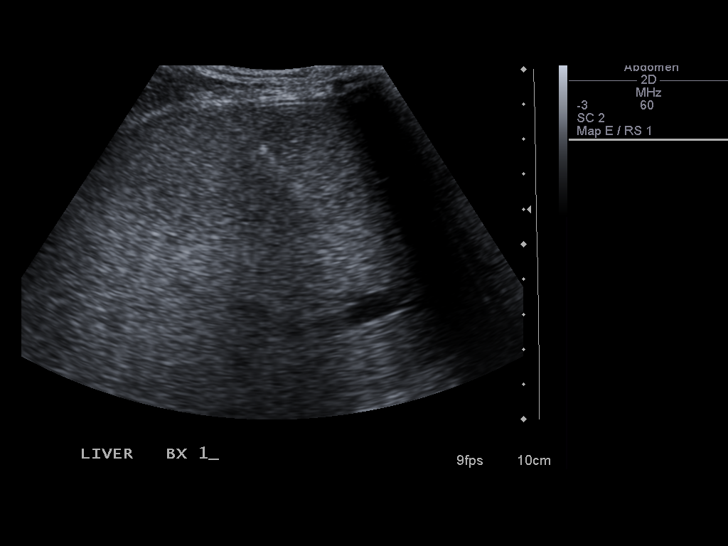
[im 9/12]
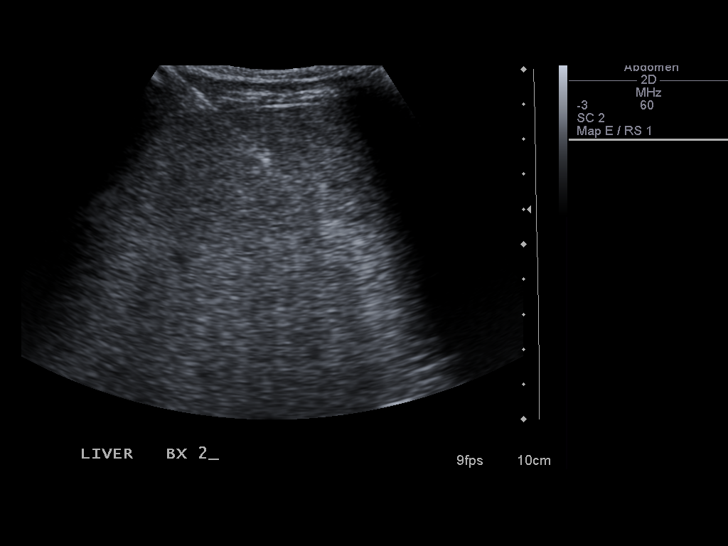
[im 10/12]
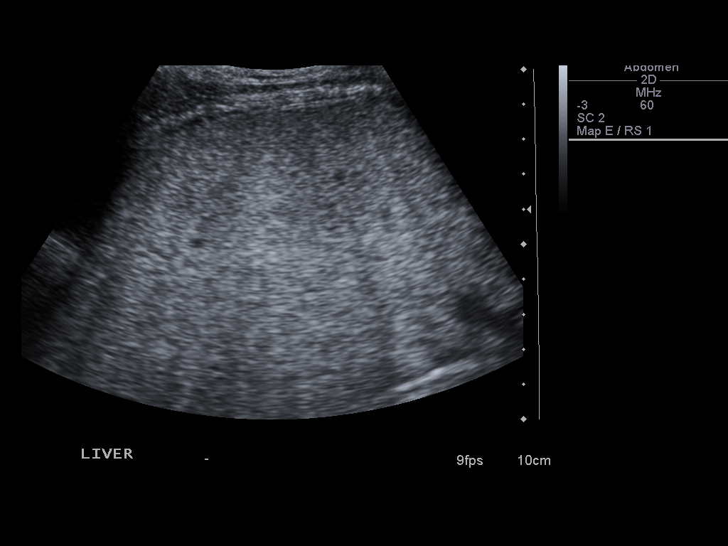
[im 11/12]
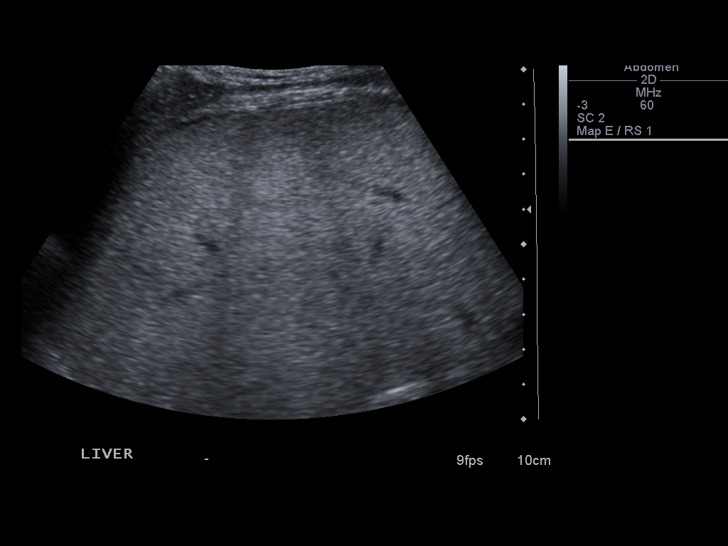
[im 12/12]
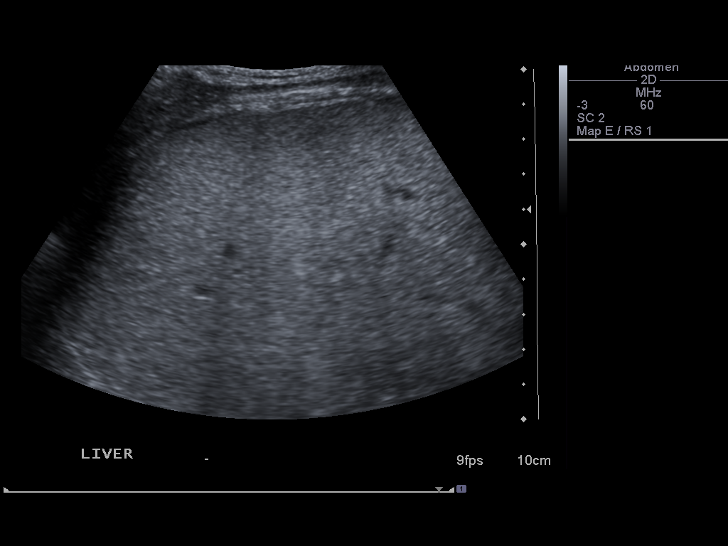

[12 of 12 positions shown; findings below may reference images not displayed]

abdominal ultrasound -
09/14/2011

Medications: Fentanyl 75 mcg IV; Versed 2 mg IV

Total Moderate Sedation time: 8  minutes

Complications: None immediate

Procedure:

Informed written consent was obtained from the patient after a
discussion of the risks, benefits and alternatives to treatment.
The patient understands and consents to the procedure.  A timeout
was performed prior to the initiation of the procedure.

Ultrasound scanning was performed of the right upper abdominal
quadrant and the procedure was planned.  The right upper abdomen
was prepped and draped in the usual sterile fashion.  The overlying
soft tissues were anesthetized with 1% lidocaine.  A 17 gauge,
cm co-axial needle was advanced into a peripheral aspect of the
right lobe of the liver and 2 core biopsies were obtained with an
18 gauge core device under direct ultrasound guidance.

The co-axial needle was removed and hemostasis was obtained with
manual compression.  Post procedural scanning was negative for
definitive area of hemorrhage.  A dressing was placed.  The patient
tolerated the procedure well without immediate post procedural
complication.
IMPRESSION: Technically successful ultrasound guided liver biopsy.

## 2012-03-22 NOTE — Discharge Summary (Signed)
Read and reviewed.  Please see Discharge SRA for further discharge information.

## 2017-05-05 ENCOUNTER — Encounter (HOSPITAL_COMMUNITY): Payer: Self-pay

## 2017-05-05 ENCOUNTER — Emergency Department (HOSPITAL_COMMUNITY)
Admission: EM | Admit: 2017-05-05 | Discharge: 2017-05-06 | Disposition: A | Discharge status: Home / Self Care | Payer: BLUE CROSS/BLUE SHIELD | Attending: Emergency Medicine | Admitting: Emergency Medicine

## 2017-05-05 DIAGNOSIS — D649 Anemia, unspecified: Secondary | ICD-10-CM | POA: Diagnosis not present

## 2017-05-05 DIAGNOSIS — K529 Noninfective gastroenteritis and colitis, unspecified: Secondary | ICD-10-CM | POA: Diagnosis not present

## 2017-05-05 DIAGNOSIS — R1031 Right lower quadrant pain: Secondary | ICD-10-CM | POA: Diagnosis present

## 2017-05-05 DIAGNOSIS — Z87891 Personal history of nicotine dependence: Secondary | ICD-10-CM | POA: Insufficient documentation

## 2017-05-05 LAB — COMPREHENSIVE METABOLIC PANEL
ALT: 18 U/L (ref 17–63)
ANION GAP: 9 (ref 5–15)
AST: 24 U/L (ref 15–41)
Albumin: 4.3 g/dL (ref 3.5–5.0)
Alkaline Phosphatase: 49 U/L (ref 38–126)
BUN: 8 mg/dL (ref 6–20)
CALCIUM: 9.8 mg/dL (ref 8.9–10.3)
CO2: 25 mmol/L (ref 22–32)
Chloride: 104 mmol/L (ref 101–111)
Creatinine, Ser: 0.82 mg/dL (ref 0.61–1.24)
Glucose, Bld: 114 mg/dL — ABNORMAL HIGH (ref 65–99)
Potassium: 3.5 mmol/L (ref 3.5–5.1)
Sodium: 138 mmol/L (ref 135–145)
TOTAL PROTEIN: 7.1 g/dL (ref 6.5–8.1)
Total Bilirubin: 2.5 mg/dL — ABNORMAL HIGH (ref 0.3–1.2)

## 2017-05-05 LAB — CBC
HCT: 43 % (ref 39.0–52.0)
HEMOGLOBIN: 14.9 g/dL (ref 13.0–17.0)
MCH: 32 pg (ref 26.0–34.0)
MCHC: 34.7 g/dL (ref 30.0–36.0)
MCV: 92.3 fL (ref 78.0–100.0)
PLATELETS: 238 10*3/uL (ref 150–400)
RBC: 4.66 MIL/uL (ref 4.22–5.81)
RDW: 12.1 % (ref 11.5–15.5)
WBC: 8.7 10*3/uL (ref 4.0–10.5)

## 2017-05-05 LAB — URINALYSIS, ROUTINE W REFLEX MICROSCOPIC
Bilirubin Urine: NEGATIVE
Glucose, UA: NEGATIVE mg/dL
HGB URINE DIPSTICK: NEGATIVE
Ketones, ur: 5 mg/dL — AB
LEUKOCYTES UA: NEGATIVE
NITRITE: NEGATIVE
PROTEIN: NEGATIVE mg/dL
Specific Gravity, Urine: 1.024 (ref 1.005–1.030)
pH: 5 (ref 5.0–8.0)

## 2017-05-05 LAB — LIPASE, BLOOD: LIPASE: 33 U/L (ref 11–51)

## 2017-05-05 NOTE — ED Triage Notes (Signed)
Pt reports abd pain that he woke up this morning with RLQ pain. He reports the radiates to the left side under his ribcage. Hx of heavy alcohol use. Pt reports one episode of vomiting. Diarrhea as well.

## 2017-05-06 ENCOUNTER — Emergency Department (HOSPITAL_COMMUNITY): Payer: BLUE CROSS/BLUE SHIELD

## 2017-05-06 MED ORDER — SUCRALFATE 1 G PO TABS: 1.0000 g | ORAL_TABLET | Freq: Three times a day (TID) | ORAL | 0 refills | Status: AC

## 2017-05-06 MED ORDER — IOPAMIDOL (ISOVUE-300) INJECTION 61%
INTRAVENOUS | Status: AC
  Administered 2017-05-06: 100 mL
  Filled 2017-05-06: qty 100

## 2017-05-06 MED ORDER — TRAMADOL HCL 50 MG PO TABS: 50.0000 mg | ORAL_TABLET | Freq: Four times a day (QID) | ORAL | 0 refills | Status: AC | PRN

## 2017-05-06 MED ORDER — IOPAMIDOL (ISOVUE-300) INJECTION 61%
INTRAVENOUS | Status: AC
  Filled 2017-05-06: qty 30

## 2017-05-06 MED ORDER — PROMETHAZINE HCL 25 MG PO TABS: 25.0000 mg | ORAL_TABLET | Freq: Four times a day (QID) | ORAL | 0 refills | Status: AC | PRN

## 2017-05-06 MED ORDER — SODIUM CHLORIDE 0.9 % IV BOLUS (SEPSIS)
1000.0000 mL | Freq: Once | INTRAVENOUS | Status: AC
  Administered 2017-05-06: 1000 mL via INTRAVENOUS

## 2017-05-06 MED ORDER — MORPHINE SULFATE (PF) 4 MG/ML IV SOLN
4.0000 mg | Freq: Once | INTRAVENOUS | Status: AC
  Administered 2017-05-06: 4 mg via INTRAVENOUS
  Filled 2017-05-06: qty 1

## 2017-05-06 NOTE — Discharge Instructions (Signed)
Return here as needed.  Follow-up with your primary doctor.  This is most likely a GI illness.  There is some inflammation in the bowels that suggest this as well noted on the CT scan.

## 2017-05-06 NOTE — ED Notes (Signed)
PT states understanding of care given, follow up care, and medication prescribed. PT ambulated from ED to car with a steady gait. 

## 2017-05-06 NOTE — ED Notes (Signed)
Patient transported to CT 

## 2017-05-07 NOTE — ED Provider Notes (Signed)
[facility]-EMERGENCY DEPT Provider Note   CSN: [id number] Arrival date & time: 05/05/17  1529     History   Chief Complaint Chief Complaint  Patient presents with  . Abdominal Pain    HPI Troy Foster is a 41 y.o. male.  HPI Patient presents to the emergency department with right lower abdominal pain.  Patient states that this started this morning.  The patient states that he did not take any medications prior to arrival.  Patient states that the pain does seem to go towards the left as well.  He states that he did have one episode of vomiting and some diarrhea as well.The patient denies chest pain, shortness of breath, headache,blurred vision, neck pain, fever, cough, weakness, numbness, dizziness, anorexia, edema, rash, back pain, dysuria, hematemesis, bloody stool, near syncope, or syncope. Past Medical History:  Diagnosis Date  . Alcohol abuse   . Anemia   . Elevated liver enzymes   . Fatty liver   . GERD (gastroesophageal reflux disease)     Patient Active Problem List   Diagnosis Date Noted  . Alcohol abuse, continuous 03/06/2012    Class: Acute  . Substance induced mood disorder (HCC) 03/06/2012    Past Surgical History:  Procedure Laterality Date  . LIVER BIOPSY  10/2011       Home Medications    Prior to Admission medications   Medication Sig Start Date End Date Taking? Authorizing Provider  promethazine (PHENERGAN) 25 MG tablet Take 1 tablet (25 mg total) by mouth every 6 (six) hours as needed for nausea or vomiting. 05/06/17   Akhila Mahnken, Cristal Deer, PA-C  sucralfate (CARAFATE) 1 g tablet Take 1 tablet (1 g total) by mouth 4 (four) times daily -  with meals and at bedtime. 05/06/17   Alette Kataoka, Cristal Deer, PA-C  traMADol (ULTRAM) 50 MG tablet Take 1 tablet (50 mg total) by mouth every 6 (six) hours as needed for severe pain. 05/06/17   Charlestine Night, PA-C    Family History Family History  Problem Relation Age of Onset  . Diabetes Father   . Testicular cancer  Father   . Diabetes Paternal Aunt   . Colon cancer Neg Hx     Social History Social History  Substance Use Topics  . Smoking status: Former Smoker    Years: 6.00    Types: Cigarettes    Quit date: 06/21/2011  . Smokeless tobacco: Never Used     Comment: still smokes about 1 pack per month cigarettes  . Alcohol use Yes     Comment: 3 1/2 gal./week of Vodka.     Allergies   Patient has no known allergies.   Review of Systems Review of Systems  All other systems negative except as documented in the HPI. All pertinent positives and negatives as reviewed in the HPI. Physical Exam Updated Vital Signs BP 126/84   Pulse 64   Temp 98 F (36.7 C) (Oral)   Resp 15   SpO2 100%   Physical Exam  Constitutional: He is oriented to person, place, and time. He appears well-developed and well-nourished. No distress.  HENT:  Head: Normocephalic and atraumatic.  Mouth/Throat: Oropharynx is clear and moist.  Eyes: Pupils are equal, round, and reactive to light.  Neck: Normal range of motion. Neck supple.  Cardiovascular: Normal rate, regular rhythm and normal heart sounds.  Exam reveals no gallop and no friction rub.   No murmur heard. Pulmonary/Chest: Effort normal and breath sounds normal. No respiratory distress. He has no  wheezes.  Abdominal: Soft. Bowel sounds are normal. He exhibits no distension and no mass. There is tenderness. There is no guarding.    Neurological: He is alert and oriented to person, place, and time. He exhibits normal muscle tone. Coordination normal.  Skin: Skin is warm and dry. Capillary refill takes less than 2 seconds. No rash noted. No erythema.  Psychiatric: He has a normal mood and affect. His behavior is normal.  Nursing note and vitals reviewed.    ED Treatments / Results  Labs (all labs ordered are listed, but only abnormal results are displayed) Labs Reviewed  COMPREHENSIVE METABOLIC PANEL - Abnormal; Notable for the following:       Result  Value   Glucose, Bld 114 (*)    Total Bilirubin 2.5 (*)    All other components within normal limits  URINALYSIS, ROUTINE W REFLEX MICROSCOPIC - Abnormal; Notable for the following:    Ketones, ur 5 (*)    All other components within normal limits  LIPASE, BLOOD  CBC    EKG  EKG Interpretation None       Radiology Ct Abdomen Pelvis W Contrast  Result Date: 05/06/2017 CLINICAL DATA:  41 y/o  M; severe abdominal pain. EXAM: CT ABDOMEN AND PELVIS WITH CONTRAST TECHNIQUE: Multidetector CT imaging of the abdomen and pelvis was performed using the standard protocol following bolus administration of intravenous contrast. CONTRAST:  ISOVUE-300 IOPAMIDOL (ISOVUE-300) INJECTION 61% COMPARISON:  09/14/2011 CT abdomen and pelvis FINDINGS: Lower chest: No acute abnormality. Hepatobiliary: No focal liver abnormality is seen. No gallstones, gallbladder wall thickening, or biliary dilatation. Pancreas: Unremarkable. No pancreatic ductal dilatation or surrounding inflammatory changes. Spleen: Normal in size without focal abnormality. Adrenals/Urinary Tract: Adrenal glands are unremarkable. Kidneys are normal, without renal calculi, focal lesion, or hydronephrosis. Bladder is unremarkable. Stomach/Bowel: Mild dilatation and diffuse wall thickening of the ileum with loss of normal bowel features. Small volume of ascites in right lower quadrant and the pelvis. Large and small bowel is otherwise unremarkable. Stomach is unremarkable. Normal appendix. Vascular/Lymphatic: No significant vascular findings are present. No enlarged abdominal or pelvic lymph nodes. Reproductive: Prostate is unremarkable. Other: No hernia. Musculoskeletal: No acute or significant osseous findings. IMPRESSION: Inflammatory changes of the ileum may be due to infectious enteritis or inflammatory bowel disease. Small volume of ascites, likely reactive. No evidence for perforation or abscess. Electronically Signed   By: Mitzi Hansen M.D.   On: 05/06/2017 04:09    Procedures Procedures (including critical care time)  Medications Ordered in ED Medications  sodium chloride 0.9 % bolus 1,000 mL (0 mLs Intravenous Stopped 05/06/17 0200)  morphine 4 MG/ML injection 4 mg (4 mg Intravenous Given 05/06/17 0048)  iopamidol (ISOVUE-300) 61 % injection (100 mLs  Contrast Given 05/06/17 0341)     Initial Impression / Assessment and Plan / ED Course  I have reviewed the triage vital signs and the nursing notes.  Pertinent labs & imaging results that were available during my care of the patient were reviewed by me and considered in my medical decision making (see chart for details).     Patient will have CT scan performed laboratory testing.  Patient is given IV pain medication and antiemetics, along with IV fluids.    Patient is advised of the test results and that this is most likely a gastroenteritis.  Patient's best return here for any worsening in his condition.  Patient agrees the plan and all questions were answered  Final Clinical Impressions(s) /  ED Diagnoses   Final diagnoses:  Gastroenteritis    New Prescriptions Discharge Medication List as of 05/06/2017  5:32 AM    START taking these medications   Details  promethazine (PHENERGAN) 25 MG tablet Take 1 tablet (25 mg total) by mouth every 6 (six) hours as needed for nausea or vomiting., Starting Wed 05/06/2017, Print    sucralfate (CARAFATE) 1 g tablet Take 1 tablet (1 g total) by mouth 4 (four) times daily -  with meals and at bedtime., Starting Wed 05/06/2017, Print    traMADol (ULTRAM) 50 MG tablet Take 1 tablet (50 mg total) by mouth every 6 (six) hours as needed for severe pain., Starting Wed 05/06/2017, Print         Shermaine Rivet, [place], PA-C 05/07/17 [id number]    Alvira MondaySchlossman, Erin, MD 05/10/17 [phone]

## 2017-09-25 IMAGING — CT CT ABD-PELV W/ CM
2 of 4 series · 17 of 46 positions shown, 19 images · IV contrast (iopamidol)
Comparison: 09/14/2011 CT abdomen and pelvis

CLINICAL DATA: 40 y/o  M; severe abdominal pain.

EXAM:
CT ABDOMEN AND PELVIS WITH CONTRAST
TECHNIQUE: Multidetector CT imaging of the abdomen and pelvis was performed
using the standard protocol following bolus administration of
intravenous contrast.
CONTRAST:  100mL [id number] IOPAMIDOL (AL155N-N44) INJECTION 61%

[Series 3: abd/ pelvis 5.0 i30f 2 · axial · 0.69mm/px · z∈[+692,+1082]mm · 14 of 86 slices shown, 16 images]
[im 4/86  soft-tissue]
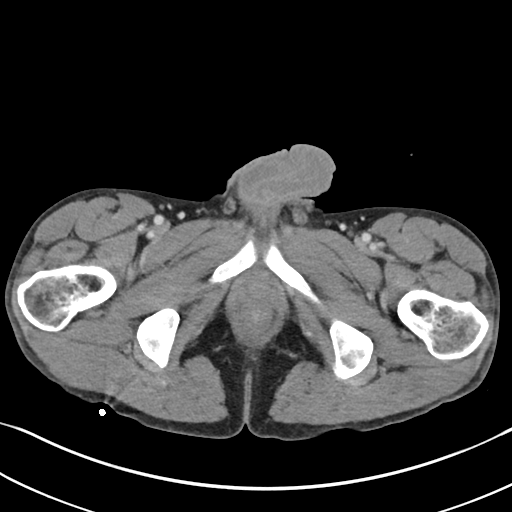
[im 4/86  bone]
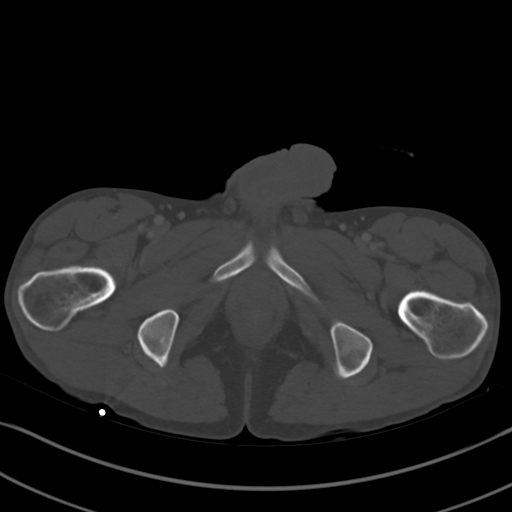
[im 11/86  soft-tissue]
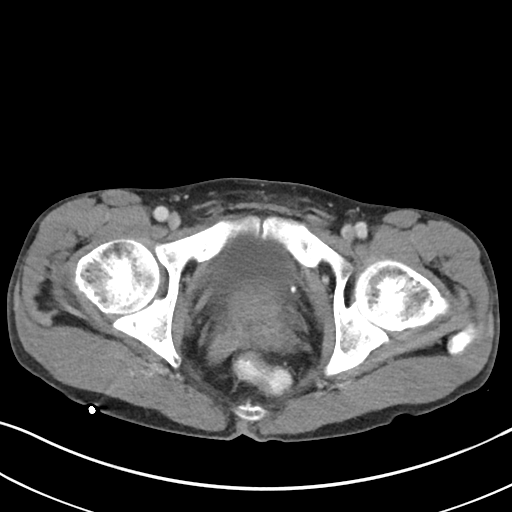
[im 18/86  soft-tissue]
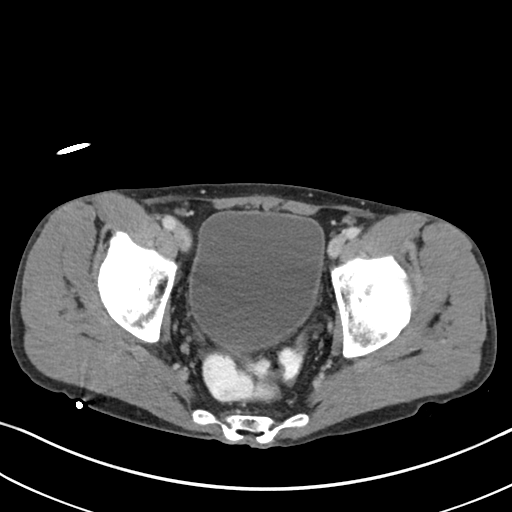
[im 24/86  soft-tissue]
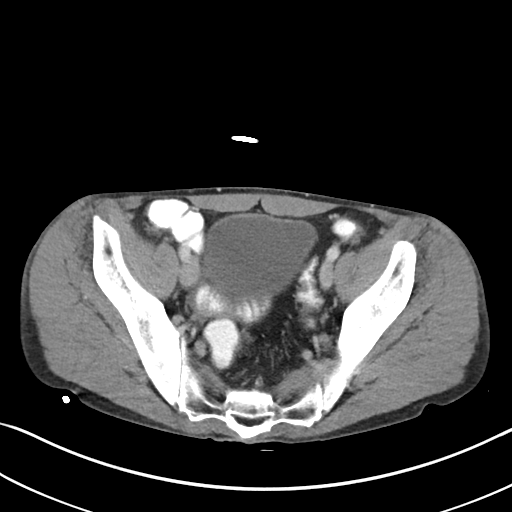
[im 28/86  soft-tissue]
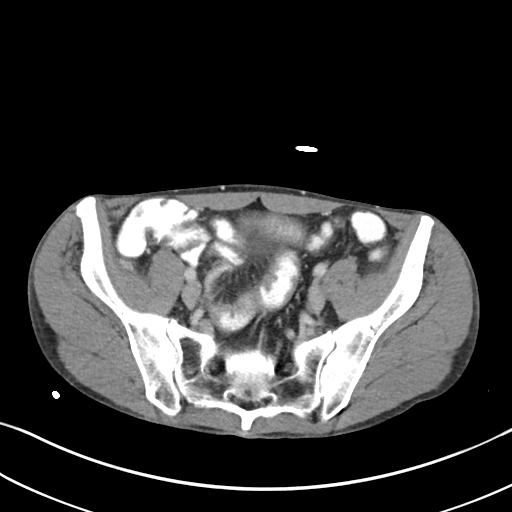
[im 35/86  soft-tissue]
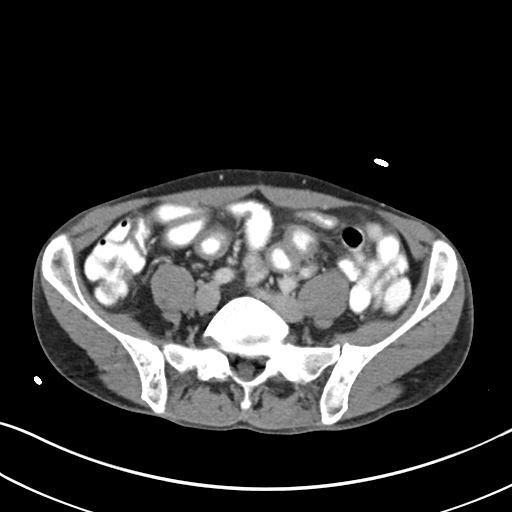
[im 41/86  soft-tissue]
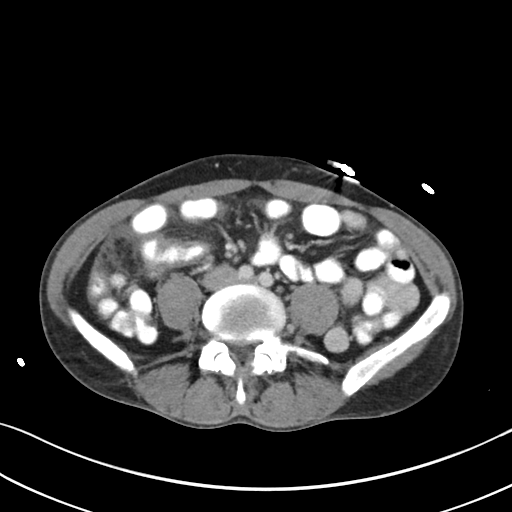
[im 45/86  soft-tissue]
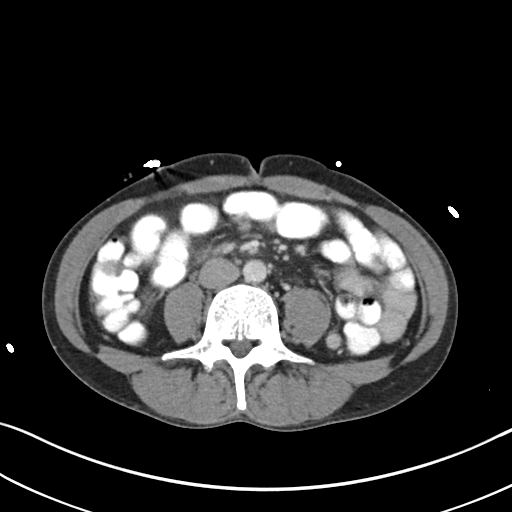
[im 52/86  soft-tissue]
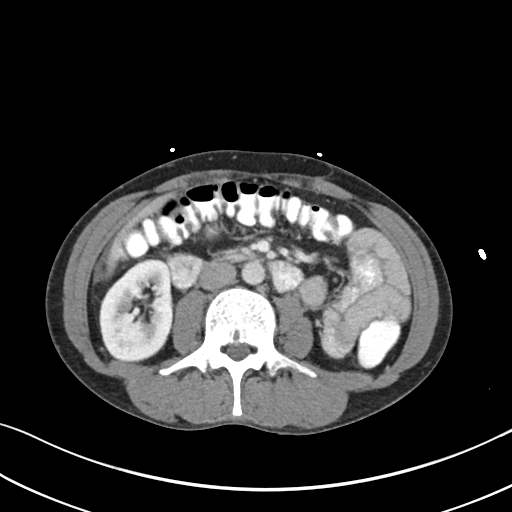
[im 52/86  bone]
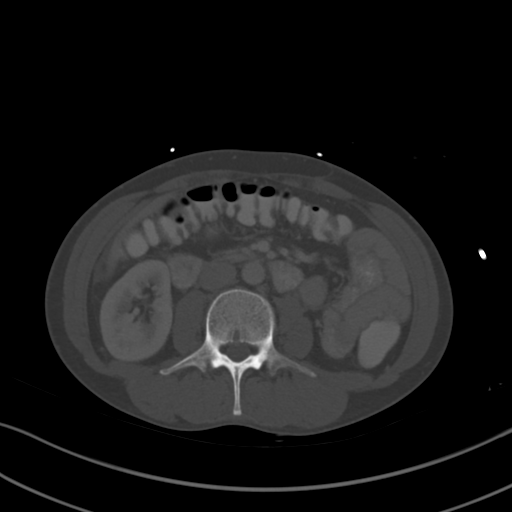
[im 58/86  soft-tissue]
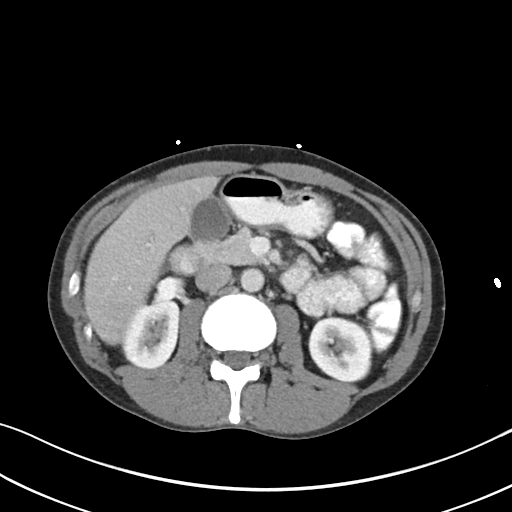
[im 65/86  soft-tissue]
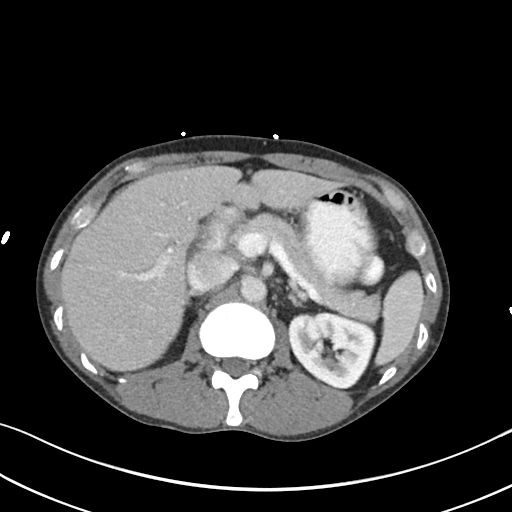
[im 69/86  soft-tissue]
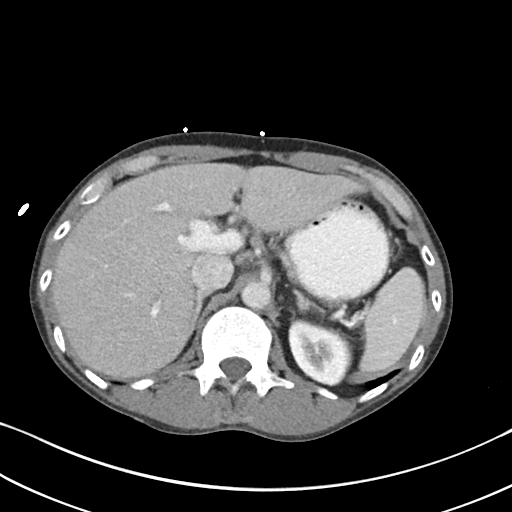
[im 75/86  soft-tissue]
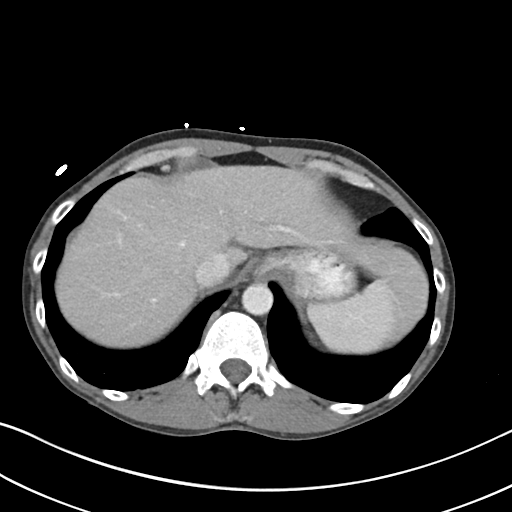
[im 82/86  soft-tissue]
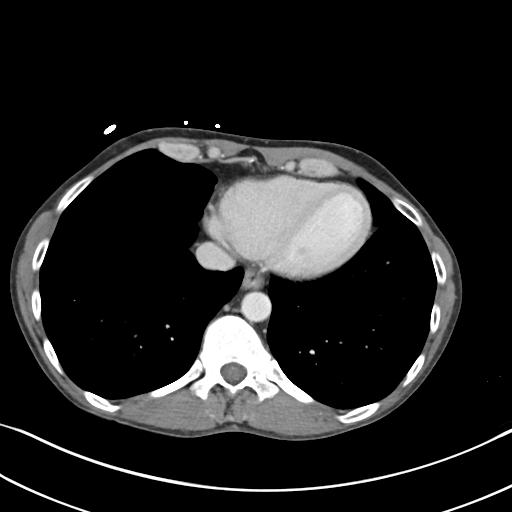

[Series 6: coronal soft tissue · coronal · 0.83mm/px · 3 of 100 slices shown]
[im 34/100  soft-tissue]
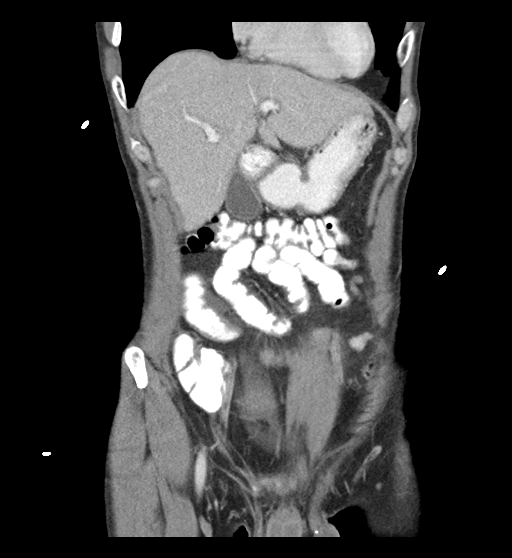
[im 45/100  soft-tissue]
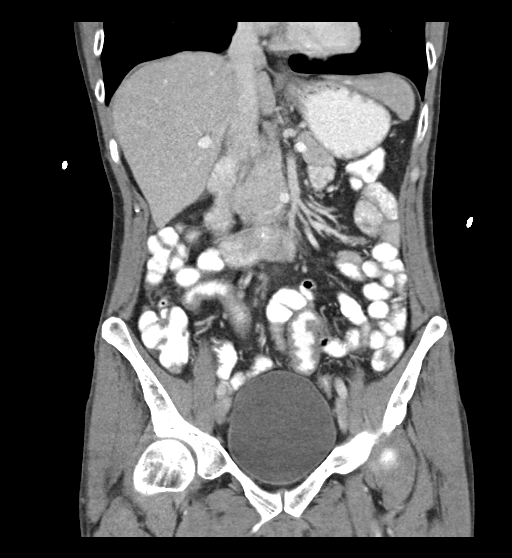
[im 56/100  soft-tissue]
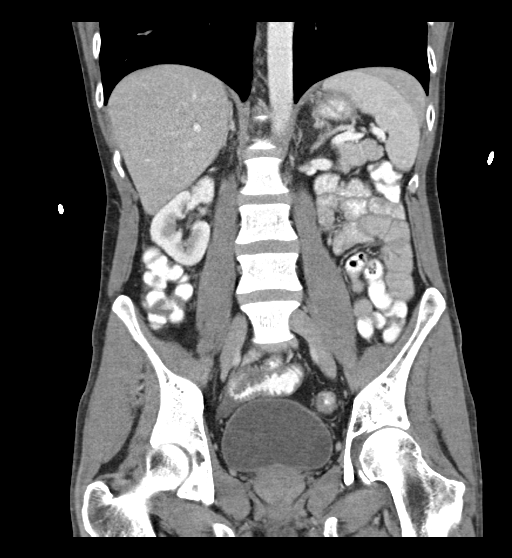

[17 of 46 positions shown; findings below may reference images not displayed]

FINDINGS: Lower chest: No acute abnormality.

Hepatobiliary: No focal liver abnormality is seen. No gallstones,
gallbladder wall thickening, or biliary dilatation.

Pancreas: Unremarkable. No pancreatic ductal dilatation or
surrounding inflammatory changes.

Spleen: Normal in size without focal abnormality.

Adrenals/Urinary Tract: Adrenal glands are unremarkable. Kidneys are
normal, without renal calculi, focal lesion, or hydronephrosis.
Bladder is unremarkable.

Stomach/Bowel: Mild dilatation and diffuse wall thickening of the
ileum with loss of normal bowel features. Small volume of ascites in
right lower quadrant and the pelvis. Large and small bowel is
otherwise unremarkable. Stomach is unremarkable. Normal appendix.

Vascular/Lymphatic: No significant vascular findings are present. No
enlarged abdominal or pelvic lymph nodes.

Reproductive: Prostate is unremarkable.

Other: No hernia.

Musculoskeletal: No acute or significant osseous findings.
IMPRESSION: Inflammatory changes of the ileum may be due to infectious enteritis
or inflammatory bowel disease. Small volume of ascites, likely
reactive. No evidence for perforation or abscess.

By: Leidi Havens M.D.

## 2021-03-09 ENCOUNTER — Other Ambulatory Visit: Payer: Self-pay

## 2021-03-09 ENCOUNTER — Emergency Department
Admission: EM | Admit: 2021-03-09 | Discharge: 2021-03-09 | Disposition: A | Discharge status: Home / Self Care | Payer: BLUE CROSS/BLUE SHIELD | Attending: Emergency Medicine | Admitting: Emergency Medicine

## 2021-03-09 ENCOUNTER — Encounter: Payer: Self-pay | Admitting: Emergency Medicine

## 2021-03-09 DIAGNOSIS — R252 Cramp and spasm: Secondary | ICD-10-CM | POA: Diagnosis not present

## 2021-03-09 DIAGNOSIS — R1012 Left upper quadrant pain: Secondary | ICD-10-CM | POA: Insufficient documentation

## 2021-03-09 DIAGNOSIS — Z5321 Procedure and treatment not carried out due to patient leaving prior to being seen by health care provider: Secondary | ICD-10-CM | POA: Diagnosis not present

## 2021-03-09 DIAGNOSIS — R0602 Shortness of breath: Secondary | ICD-10-CM | POA: Diagnosis not present

## 2021-03-09 LAB — COMPREHENSIVE METABOLIC PANEL
ALT: 27 U/L (ref 0–44)
AST: 42 U/L — ABNORMAL HIGH (ref 15–41)
Albumin: 4.2 g/dL (ref 3.5–5.0)
Alkaline Phosphatase: 51 U/L (ref 38–126)
Anion gap: 7 (ref 5–15)
BUN: <5 mg/dL — ABNORMAL LOW (ref 6–20)
CO2: 30 mmol/L (ref 22–32)
Calcium: 8.7 mg/dL — ABNORMAL LOW (ref 8.9–10.3)
Chloride: 99 mmol/L (ref 98–111)
Creatinine, Ser: 0.63 mg/dL (ref 0.61–1.24)
GFR, Estimated: >60 mL/min (ref >60)
Glucose, Bld: 108 mg/dL — ABNORMAL HIGH (ref 70–99)
Potassium: 3.5 mmol/L (ref 3.5–5.1)
Sodium: 136 mmol/L (ref 135–145)
Total Bilirubin: 1.6 mg/dL — ABNORMAL HIGH (ref 0.3–1.2)
Total Protein: 7.2 g/dL (ref 6.5–8.1)

## 2021-03-09 LAB — URINALYSIS, COMPLETE (UACMP) WITH MICROSCOPIC
Bacteria, UA: NONE SEEN
Bilirubin Urine: NEGATIVE
Glucose, UA: NEGATIVE mg/dL
Hgb urine dipstick: NEGATIVE
Ketones, ur: NEGATIVE mg/dL
Leukocytes,Ua: NEGATIVE
Nitrite: NEGATIVE
Protein, ur: NEGATIVE mg/dL
Specific Gravity, Urine: 1.001 — ABNORMAL LOW (ref 1.005–1.030)
Squamous Epithelial / LPF: NONE SEEN (ref 0–5)
WBC, UA: NONE SEEN WBC/hpf (ref 0–5)
pH: 7 (ref 5.0–8.0)

## 2021-03-09 LAB — CBC
HCT: 41.4 % (ref 39.0–52.0)
Hemoglobin: 14.6 g/dL (ref 13.0–17.0)
MCH: 31.8 pg (ref 26.0–34.0)
MCHC: 35.3 g/dL (ref 30.0–36.0)
MCV: 90.2 fL (ref 80.0–100.0)
Platelets: 237 10*3/uL (ref 150–400)
RBC: 4.59 MIL/uL (ref 4.22–5.81)
RDW: 12.6 % (ref 11.5–15.5)
WBC: 3.5 10*3/uL — ABNORMAL LOW (ref 4.0–10.5)
nRBC: 0 % (ref 0.0–0.2)

## 2021-03-09 LAB — LIPASE, BLOOD: Lipase: 50 U/L (ref 11–51)

## 2021-03-09 NOTE — ED Notes (Signed)
Called pt at this time

## 2021-03-09 NOTE — ED Triage Notes (Signed)
Pt comes pov with leg cramping, LUQ pain, and some SOB. Pt is a drinker-last drink 2 days ago and had 6 beers. Nausea but no vomiting.

## 2021-03-09 NOTE — ED Triage Notes (Signed)
Pt reports that he was at work today, he became short of breath and got leg cramps. Pts wife reports that he is a recovering alcoholic and at times has trouble with his K+ dropping. They were concerned about his K+ level because of the cramping. He is able to speak in complete clear sentences.

## 2021-03-09 NOTE — ED Notes (Signed)
Pt called for the second time. No answer

## 2021-03-13 ENCOUNTER — Emergency Department: Payer: BLUE CROSS/BLUE SHIELD

## 2021-03-13 ENCOUNTER — Emergency Department
Admission: EM | Admit: 2021-03-13 | Discharge: 2021-03-13 | Disposition: A | Discharge status: Home / Self Care | Payer: BLUE CROSS/BLUE SHIELD | Attending: Emergency Medicine | Admitting: Emergency Medicine

## 2021-03-13 ENCOUNTER — Other Ambulatory Visit: Payer: Self-pay

## 2021-03-13 DIAGNOSIS — Z87891 Personal history of nicotine dependence: Secondary | ICD-10-CM | POA: Diagnosis not present

## 2021-03-13 DIAGNOSIS — K760 Fatty (change of) liver, not elsewhere classified: Secondary | ICD-10-CM | POA: Insufficient documentation

## 2021-03-13 DIAGNOSIS — R1011 Right upper quadrant pain: Secondary | ICD-10-CM

## 2021-03-13 DIAGNOSIS — R0602 Shortness of breath: Secondary | ICD-10-CM | POA: Insufficient documentation

## 2021-03-13 LAB — LIPASE, BLOOD: Lipase: 51 U/L (ref 11–51)

## 2021-03-13 LAB — COMPREHENSIVE METABOLIC PANEL
ALT: 41 U/L (ref 0–44)
AST: 63 U/L — ABNORMAL HIGH (ref 15–41)
Albumin: 4.4 g/dL (ref 3.5–5.0)
Alkaline Phosphatase: 49 U/L (ref 38–126)
Anion gap: 9 (ref 5–15)
BUN: <5 mg/dL — ABNORMAL LOW (ref 6–20)
CO2: 26 mmol/L (ref 22–32)
Calcium: 8.9 mg/dL (ref 8.9–10.3)
Chloride: 101 mmol/L (ref 98–111)
Creatinine, Ser: 0.66 mg/dL (ref 0.61–1.24)
GFR, Estimated: >60 mL/min (ref >60)
Glucose, Bld: 86 mg/dL (ref 70–99)
Potassium: 3.7 mmol/L (ref 3.5–5.1)
Sodium: 136 mmol/L (ref 135–145)
Total Bilirubin: 1.7 mg/dL — ABNORMAL HIGH (ref 0.3–1.2)
Total Protein: 7.3 g/dL (ref 6.5–8.1)

## 2021-03-13 LAB — TROPONIN I (HIGH SENSITIVITY): Troponin I (High Sensitivity): 8 ng/L (ref <18)

## 2021-03-13 LAB — CBC
HCT: 40.3 % (ref 39.0–52.0)
Hemoglobin: 14.2 g/dL (ref 13.0–17.0)
MCH: 31.8 pg (ref 26.0–34.0)
MCHC: 35.2 g/dL (ref 30.0–36.0)
MCV: 90.2 fL (ref 80.0–100.0)
Platelets: 240 10*3/uL (ref 150–400)
RBC: 4.47 MIL/uL (ref 4.22–5.81)
RDW: 12.6 % (ref 11.5–15.5)
WBC: 3.9 10*3/uL — ABNORMAL LOW (ref 4.0–10.5)
nRBC: 0 % (ref 0.0–0.2)

## 2021-03-13 MED ORDER — IOHEXOL 350 MG/ML SOLN
100.0000 mL | Freq: Once | INTRAVENOUS | Status: AC | PRN
  Administered 2021-03-13: 100 mL via INTRAVENOUS

## 2021-03-13 NOTE — ED Notes (Signed)
Patient returned from CT

## 2021-03-13 NOTE — ED Notes (Signed)
Patient transported to CT 

## 2021-03-13 NOTE — ED Triage Notes (Signed)
Pt comes with c/o RUQ pain. Pt states this started few days ago. Pt states no V/N. Pt states some diarrhea.

## 2021-03-13 NOTE — ED Provider Notes (Signed)
[facility] Emergency Department Provider Note   ____________________________________________   Event Date/Time   First MD Initiated Contact with Patient 03/13/21 1928     (approximate)  I have reviewed the triage vital signs and the nursing notes.   HISTORY  Chief Complaint RUQ    HPI Troy Foster is a 45 y.o. male with past medical history of fatty liver disease, GERD, and alcohol abuse who presents to the ED complaining of abdominal pain.  Patient reports that he has had increasing pain in the right upper quadrant of his abdomen for the past 2 to 3 days.  He describes it as sharp, radiating towards his right flank, and worse when he takes a deep breath.  It has been associated with shortness of breath but he denies any fevers, cough, or pain moving up into his chest.  He denies any history of gallstones or similar symptoms in the past.  He does report some diarrhea, but he has not had any nausea or vomiting.  He has not taken anything for his symptoms prior to arrival.        Past Medical History:  Diagnosis Date   Alcohol abuse    Anemia    Elevated liver enzymes    Fatty liver    GERD (gastroesophageal reflux disease)     Patient Active Problem List   Diagnosis Date Noted   Alcohol abuse, continuous 03/06/2012    Class: Acute   Substance induced mood disorder (HCC) 03/06/2012    Past Surgical History:  Procedure Laterality Date   LIVER BIOPSY  10/2011    Prior to Admission medications   Medication Sig Start Date End Date Taking? Authorizing Provider  promethazine (PHENERGAN) 25 MG tablet Take 1 tablet (25 mg total) by mouth every 6 (six) hours as needed for nausea or vomiting. 05/06/17   Lawyer, Cristal Deer, PA-C  sucralfate (CARAFATE) 1 g tablet Take 1 tablet (1 g total) by mouth 4 (four) times daily -  with meals and at bedtime. 05/06/17   Lawyer, Cristal Deer, PA-C  traMADol (ULTRAM) 50 MG tablet Take 1 tablet (50 mg total) by mouth  every 6 (six) hours as needed for severe pain. 05/06/17   Charlestine Night, PA-C    Allergies Patient has no known allergies.  Family History  Problem Relation Age of Onset   Diabetes Father    Testicular cancer Father    Diabetes Paternal Aunt    Colon cancer Neg Hx     Social History Social History   Tobacco Use   Smoking status: Former    Years: 6.00    Types: Cigarettes    Quit date: 06/21/2011    Years since quitting: 9.7   Smokeless tobacco: Never   Tobacco comments:    still smokes about 1 pack per month cigarettes  Substance Use Topics   Alcohol use: Yes    Alcohol/week: 3.0 - 4.0 standard drinks    Types: 3 - 4 Cans of beer per week    Comment: soc 3 days ago   Drug use: No    Review of Systems  Constitutional: No fever/chills Eyes: No visual changes. ENT: No sore throat. Cardiovascular: Denies chest pain. Respiratory: Positive for shortness of breath. Gastrointestinal: Positive for abdominal pain.  No nausea, no vomiting.  Positive for diarrhea.  No constipation. Genitourinary: Negative for dysuria. Musculoskeletal: Negative for back pain. Skin: Negative for rash. Neurological: Negative for headaches, focal weakness or numbness.  ____________________________________________   PHYSICAL EXAM:  VITAL SIGNS: ED Triage Vitals  Enc Vitals Group     BP 03/13/21 1506 104/70     Pulse Rate 03/13/21 1506 84     Resp 03/13/21 1506 19     Temp 03/13/21 1506 98 F (36.7 C)     Temp src --      SpO2 03/13/21 1506 100 %     Weight --      Height --      Head Circumference --      Peak Flow --      Pain Score 03/13/21 1504 7     Pain Loc --      Pain Edu? --      Excl. in GC? --     Constitutional: Alert and oriented. Eyes: Conjunctivae are normal. Head: Atraumatic. Nose: No congestion/rhinnorhea. Mouth/Throat: Mucous membranes are moist. Neck: Normal ROM Cardiovascular: Normal rate, regular rhythm. Grossly normal heart sounds. Respiratory:  Normal respiratory effort.  No retractions. Lungs CTAB.  Tenderness to palpation over right lateral chest wall. Gastrointestinal: Soft and tender to palpation in the right upper quadrant with no rebound or guarding.  No distention. Genitourinary: deferred Musculoskeletal: No lower extremity tenderness nor edema. Neurologic:  Normal speech and language. No gross focal neurologic deficits are appreciated. Skin:  Skin is warm, dry and intact. No rash noted. Psychiatric: Mood and affect are normal. Speech and behavior are normal.  ____________________________________________   LABS (all labs ordered are listed, but only abnormal results are displayed)  Labs Reviewed  COMPREHENSIVE METABOLIC PANEL - Abnormal; Notable for the following components:      Result Value   BUN <5 (*)    AST 63 (*)    Total Bilirubin 1.7 (*)    All other components within normal limits  CBC - Abnormal; Notable for the following components:   WBC 3.9 (*)    All other components within normal limits  LIPASE, BLOOD  URINALYSIS, COMPLETE (UACMP) WITH MICROSCOPIC  TROPONIN I (HIGH SENSITIVITY)   ____________________________________________  EKG  ED ECG REPORT I, Chesley Noon, the attending physician, personally viewed and interpreted this ECG.   Date: 03/13/2021  EKG Time: 20:39  Rate: 41  Rhythm: sinus bradycardia  Axis: Normal  Intervals:none  ST&T Change: None   PROCEDURES  Procedure(s) performed (including Critical Care):  Procedures   ____________________________________________   INITIAL IMPRESSION / ASSESSMENT AND PLAN / ED COURSE      45 year old male with past medical history of fatty liver disease, GERD, and alcohol abuse who presents to the ED complaining of 3 days of gradually worsening pain in the right upper quadrant of his abdomen moving towards his right flank with associated shortness of breath.  Labs performed from triage are unremarkable, LFTs and lipase within normal  limits.  Right upper quadrant ultrasound was also performed and shows no evidence of biliary pathology.  Given patient is now endorsing shortness of breath, EKG was performed and shows no evidence of arrhythmia or ischemia.  Troponin within normal limits and I have low suspicion for ACS, but we will perform CT scan to rule out PE.  CTA is negative for PE or other acute process.  Patient is appropriate for discharge home given reassuring work-up, some of his pain could be related to steatohepatitis and he will be provided with referral to GI for follow-up.  He was counseled to return to the ED for new or worsening symptoms, patient agrees with plan.      ____________________________________________   FINAL  CLINICAL IMPRESSION(S) / ED DIAGNOSES  Final diagnoses:  RUQ pain  Fatty liver     ED Discharge Orders     None        Note:  This document was prepared using Dragon voice recognition software and may include unintentional dictation errors.    Chesley Noon, MD 03/13/21 2239

## 2021-08-02 IMAGING — US US ABDOMEN LIMITED
1 series · 15 of 25 positions shown · non-contrast
Comparison: 05/06/2017

CLINICAL DATA: Right upper quadrant pain for 2 weeks

EXAM:
ULTRASOUND ABDOMEN LIMITED RIGHT UPPER QUADRANT

[Series 1: us abdomen limited ruq · 15 of 63 slices shown]
[im 1/63]
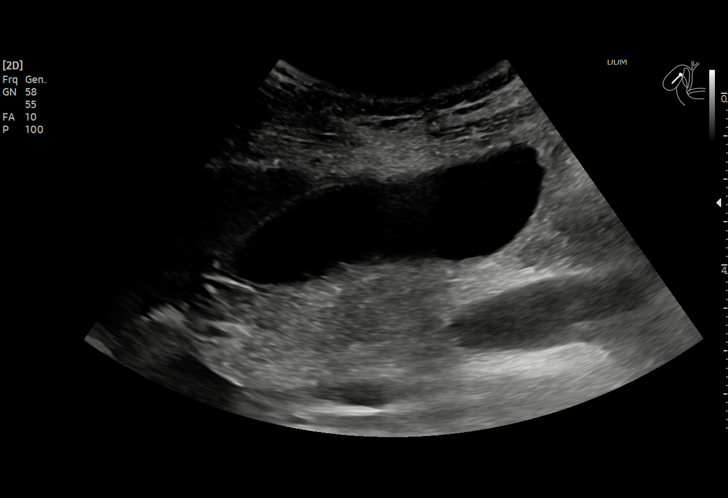
[im 6/63]
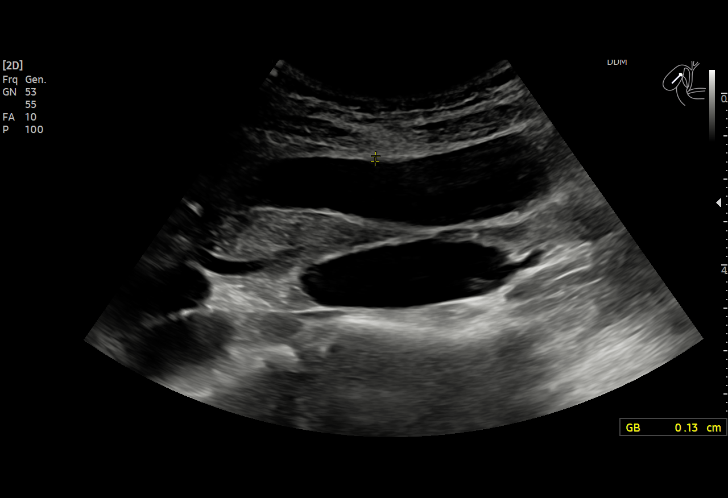
[im 11/63]
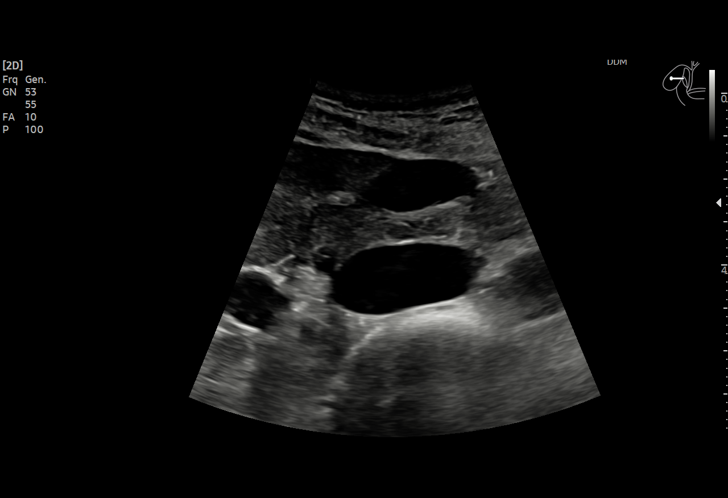
[im 13/63]
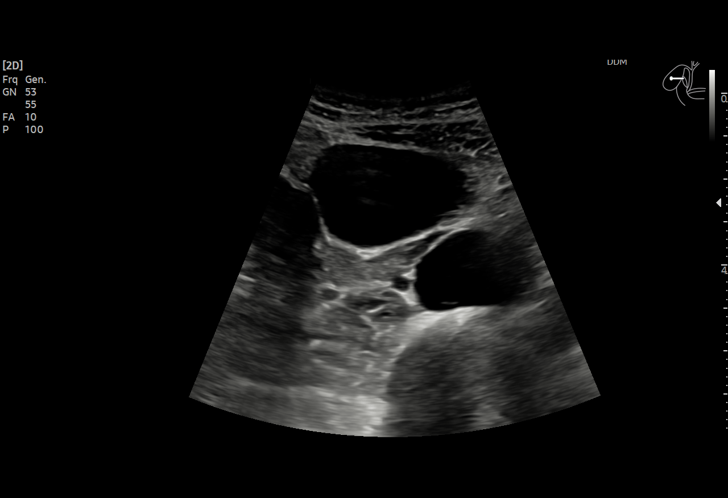
[im 19/63]
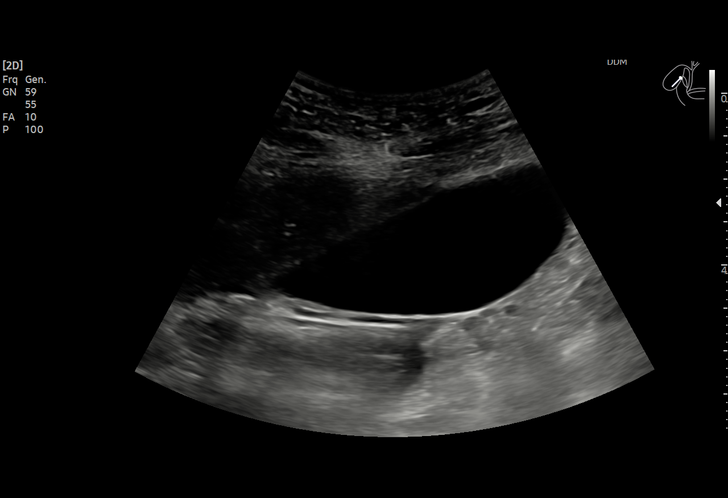
[im 24/63]
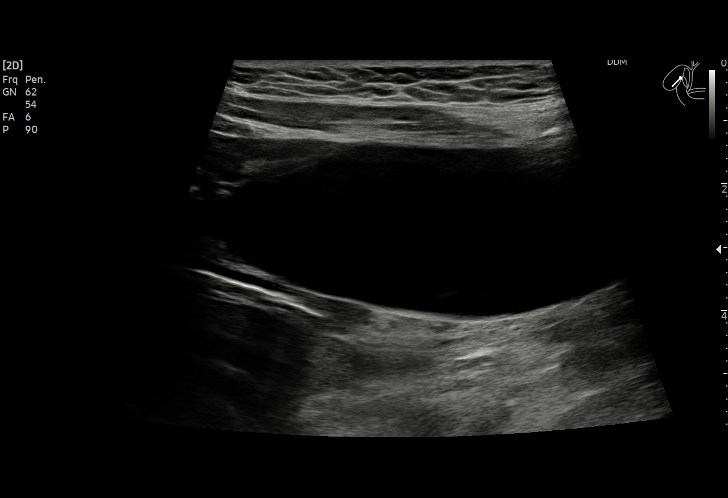
[im 26/63]
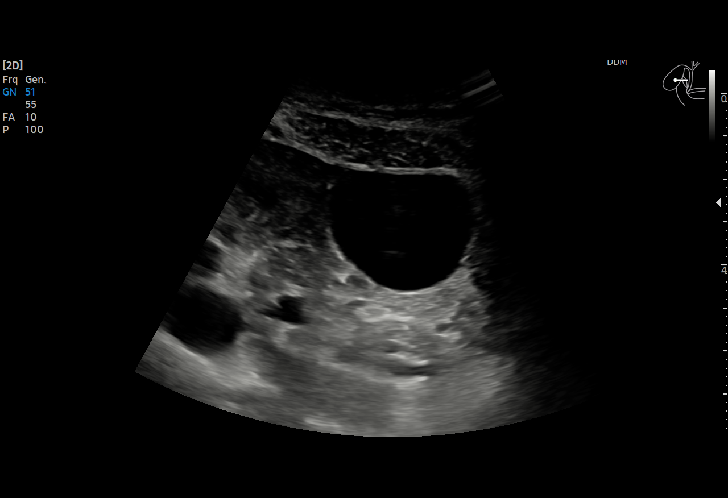
[im 32/63]
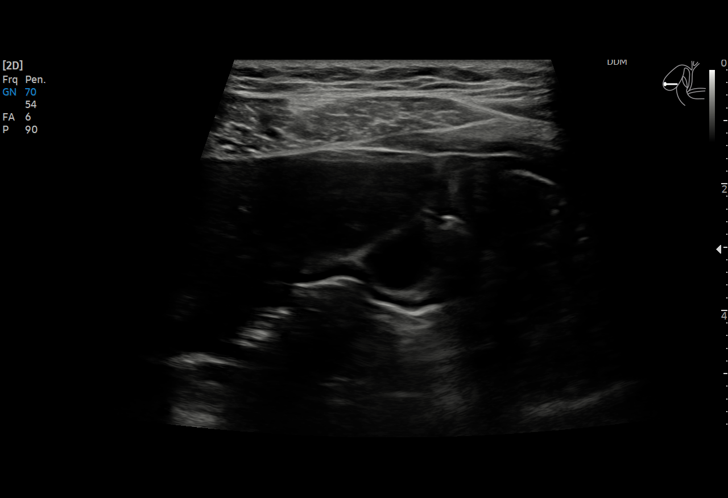
[im 37/63]
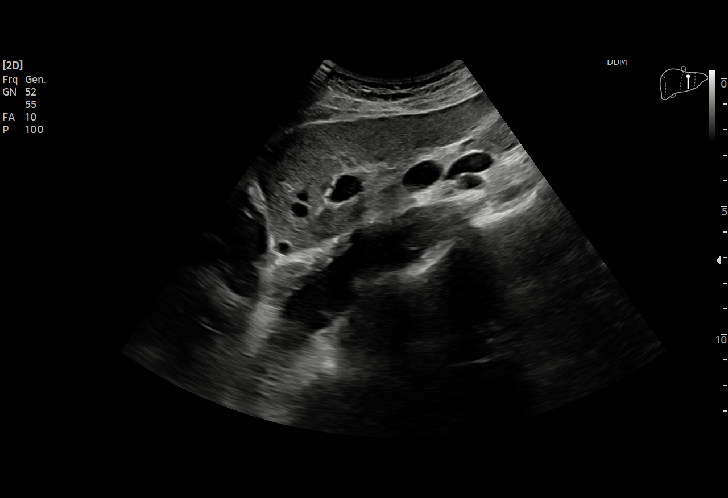
[im 39/63]
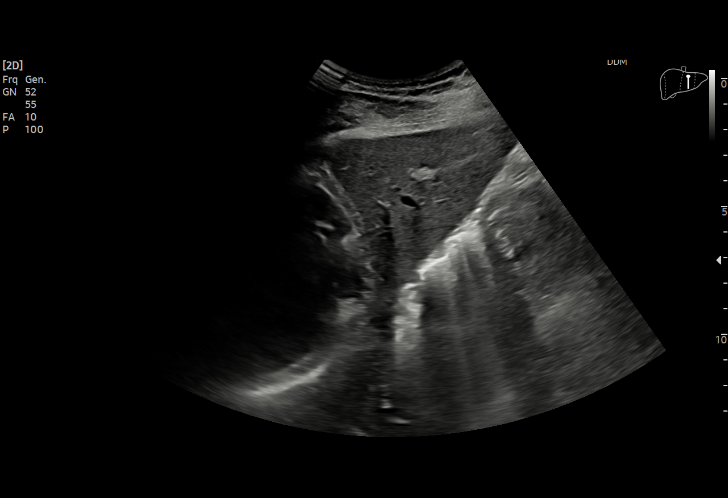
[im 44/63]
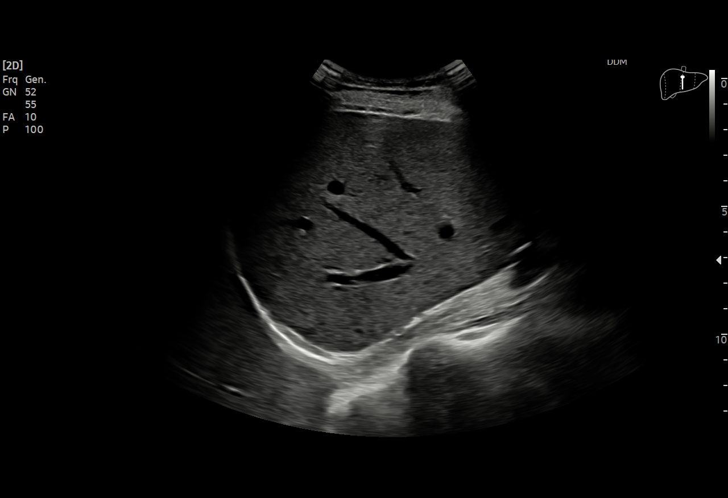
[im 50/63]
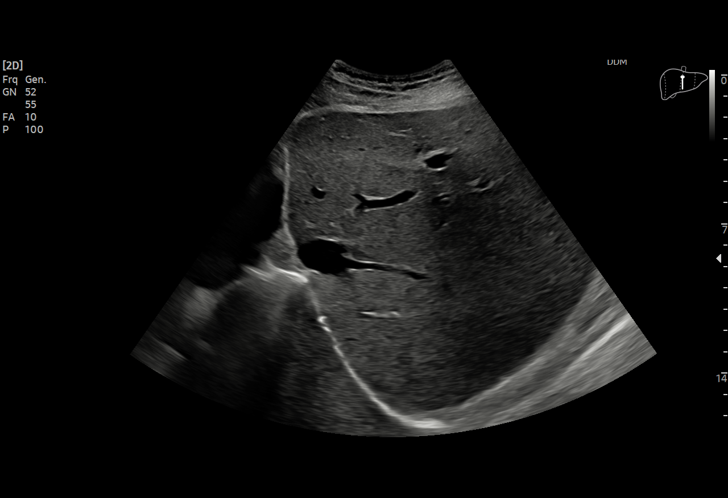
[im 52/63]
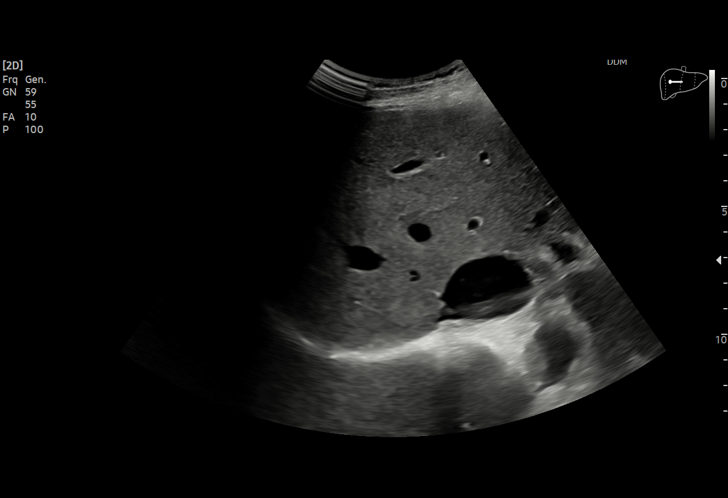
[im 57/63]
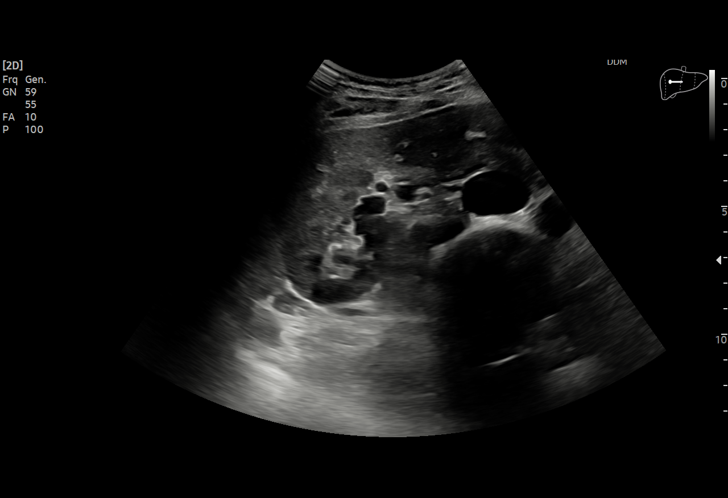
[im 63/63]
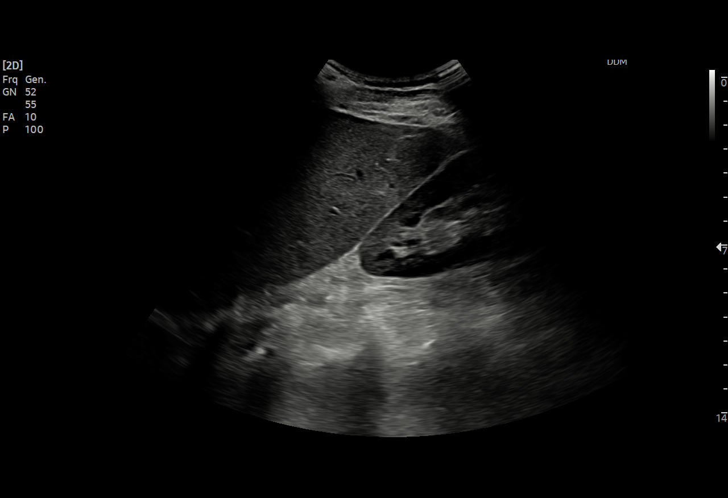

[15 of 25 positions shown; findings below may reference images not displayed]

FINDINGS: Gallbladder:

No gallstones or wall thickening visualized. No sonographic Murphy
sign noted by sonographer.

Common bile duct:

Diameter: 3 mm

Liver:

Mild increased echogenicity is noted consistent with fatty
infiltration. No focal mass is noted. Portal vein is patent on color
Doppler imaging with normal direction of blood flow towards the
liver.

Other: None.
IMPRESSION: Fatty liver.

No other focal abnormality is noted.
